# Patient Record
Sex: Female | Born: 1935 | Hispanic: No | Smoking: Never smoker
Health system: Southern US, Community
[De-identification: ages and names within clinical notes are randomized; demographics above are authoritative.]

## PROBLEM LIST (undated history)

## (undated) DIAGNOSIS — J449 Chronic obstructive pulmonary disease, unspecified: Secondary | ICD-10-CM

## (undated) DIAGNOSIS — Z950 Presence of cardiac pacemaker: Secondary | ICD-10-CM

## (undated) DIAGNOSIS — I509 Heart failure, unspecified: Secondary | ICD-10-CM

## (undated) DIAGNOSIS — I4891 Unspecified atrial fibrillation: Secondary | ICD-10-CM

## (undated) DIAGNOSIS — Z95810 Presence of automatic (implantable) cardiac defibrillator: Secondary | ICD-10-CM

## (undated) HISTORY — PX: PACEMAKER INSERTION: SHX728

---

## 2005-07-07 ENCOUNTER — Ambulatory Visit (HOSPITAL_COMMUNITY): Admission: RE | Admit: 2005-07-07 | Discharge: 2005-07-07 | Payer: Self-pay

## 2011-08-18 ENCOUNTER — Emergency Department (HOSPITAL_COMMUNITY): Payer: Medicare Other

## 2011-08-18 ENCOUNTER — Inpatient Hospital Stay (HOSPITAL_COMMUNITY)
Admission: EM | Admit: 2011-08-18 | Discharge: 2011-08-22 | DRG: 208 | Disposition: A | Discharge status: Home / Self Care | Payer: Medicare Other | Attending: Internal Medicine | Admitting: Internal Medicine

## 2011-08-18 DIAGNOSIS — K922 Gastrointestinal hemorrhage, unspecified: Secondary | ICD-10-CM | POA: Diagnosis present

## 2011-08-18 DIAGNOSIS — Z23 Encounter for immunization: Secondary | ICD-10-CM

## 2011-08-18 DIAGNOSIS — I509 Heart failure, unspecified: Secondary | ICD-10-CM | POA: Diagnosis present

## 2011-08-18 DIAGNOSIS — D649 Anemia, unspecified: Secondary | ICD-10-CM | POA: Diagnosis present

## 2011-08-18 DIAGNOSIS — Z7982 Long term (current) use of aspirin: Secondary | ICD-10-CM

## 2011-08-18 DIAGNOSIS — J449 Chronic obstructive pulmonary disease, unspecified: Secondary | ICD-10-CM | POA: Diagnosis present

## 2011-08-18 DIAGNOSIS — Z79899 Other long term (current) drug therapy: Secondary | ICD-10-CM

## 2011-08-18 DIAGNOSIS — I4891 Unspecified atrial fibrillation: Secondary | ICD-10-CM | POA: Diagnosis present

## 2011-08-18 DIAGNOSIS — Z9911 Dependence on respirator [ventilator] status: Secondary | ICD-10-CM

## 2011-08-18 DIAGNOSIS — J4489 Other specified chronic obstructive pulmonary disease: Secondary | ICD-10-CM | POA: Diagnosis present

## 2011-08-18 DIAGNOSIS — R7881 Bacteremia: Secondary | ICD-10-CM | POA: Diagnosis present

## 2011-08-18 DIAGNOSIS — N189 Chronic kidney disease, unspecified: Secondary | ICD-10-CM | POA: Diagnosis present

## 2011-08-18 DIAGNOSIS — R0902 Hypoxemia: Secondary | ICD-10-CM

## 2011-08-18 DIAGNOSIS — IMO0002 Reserved for concepts with insufficient information to code with codable children: Secondary | ICD-10-CM | POA: Diagnosis present

## 2011-08-18 DIAGNOSIS — E876 Hypokalemia: Secondary | ICD-10-CM | POA: Diagnosis present

## 2011-08-18 DIAGNOSIS — I129 Hypertensive chronic kidney disease with stage 1 through stage 4 chronic kidney disease, or unspecified chronic kidney disease: Secondary | ICD-10-CM | POA: Diagnosis present

## 2011-08-18 DIAGNOSIS — D6489 Other specified anemias: Secondary | ICD-10-CM

## 2011-08-18 DIAGNOSIS — Z7901 Long term (current) use of anticoagulants: Secondary | ICD-10-CM

## 2011-08-18 DIAGNOSIS — I5023 Acute on chronic systolic (congestive) heart failure: Secondary | ICD-10-CM | POA: Diagnosis present

## 2011-08-18 DIAGNOSIS — Z950 Presence of cardiac pacemaker: Secondary | ICD-10-CM

## 2011-08-18 DIAGNOSIS — J9600 Acute respiratory failure, unspecified whether with hypoxia or hypercapnia: Principal | ICD-10-CM | POA: Diagnosis present

## 2011-08-18 DIAGNOSIS — J811 Chronic pulmonary edema: Secondary | ICD-10-CM | POA: Diagnosis present

## 2011-08-18 LAB — DIFFERENTIAL
Basophils Absolute: 0 10*3/uL (ref 0.0–0.1)
Basophils Relative: 0 % (ref 0–1)
Eosinophils Absolute: 0 10*3/uL (ref 0.0–0.7)
Eosinophils Relative: 2 % (ref 0–5)
Lymphocytes Relative: 50 % — ABNORMAL HIGH (ref 12–46)
Monocytes Absolute: 0.3 10*3/uL (ref 0.1–1.0)
Monocytes Relative: 7 % (ref 3–12)
Monocytes Relative: 2 % — ABNORMAL LOW (ref 3–12)
Neutro Abs: 2.3 10*3/uL (ref 1.7–7.7)

## 2011-08-18 LAB — COMPREHENSIVE METABOLIC PANEL
ALT: 14 U/L (ref 0–35)
AST: 19 U/L (ref 0–37)
Alkaline Phosphatase: 33 U/L — ABNORMAL LOW (ref 39–117)
CO2: 10 mEq/L — CL (ref 19–32)
Chloride: 128 mEq/L — ABNORMAL HIGH (ref 96–112)
GFR calc non Af Amer: >60 mL/min (ref >60)
Glucose, Bld: 129 mg/dL — ABNORMAL HIGH (ref 70–99)
Sodium: 145 mEq/L (ref 135–145)
Total Bilirubin: 0.2 mg/dL — ABNORMAL LOW (ref 0.3–1.2)

## 2011-08-18 LAB — POCT I-STAT TROPONIN I: Troponin i, poc: 0.04 ng/mL (ref 0.00–0.08)

## 2011-08-18 LAB — POCT I-STAT 3, ART BLOOD GAS (G3+)
Bicarbonate: 24.9 mEq/L — ABNORMAL HIGH (ref 20.0–24.0)
Bicarbonate: 26.6 mEq/L — ABNORMAL HIGH (ref 20.0–24.0)
O2 Saturation: 83 %
Patient temperature: 36.7
TCO2: 27 mmol/L (ref 0–100)
pH, Arterial: 7.151 — CL (ref 7.350–7.400)
pH, Arterial: 7.26 — ABNORMAL LOW (ref 7.350–7.400)

## 2011-08-18 LAB — URINE MICROSCOPIC-ADD ON

## 2011-08-18 LAB — TROPONIN I: Troponin I: <0.3 ng/mL (ref <0.30)

## 2011-08-18 LAB — BASIC METABOLIC PANEL
CO2: 25 mEq/L (ref 19–32)
Chloride: 103 mEq/L (ref 96–112)
Creatinine, Ser: 1.53 mg/dL — ABNORMAL HIGH (ref 0.50–1.10)
GFR calc Af Amer: 32 mL/min — ABNORMAL LOW (ref >60)
GFR calc Af Amer: 39 mL/min — ABNORMAL LOW (ref >60)
GFR calc non Af Amer: 26 mL/min — ABNORMAL LOW (ref >60)
Potassium: 3.4 mEq/L — ABNORMAL LOW (ref 3.5–5.1)
Potassium: 4.2 mEq/L (ref 3.5–5.1)
Sodium: 142 mEq/L (ref 135–145)
Sodium: 140 mEq/L (ref 135–145)

## 2011-08-18 LAB — LACTIC ACID, PLASMA: Lactic Acid, Venous: 1.7 mmol/L (ref 0.5–2.2)

## 2011-08-18 LAB — CBC
HCT: 16.2 % — ABNORMAL LOW (ref 36.0–46.0)
HCT: 33 % — ABNORMAL LOW (ref 36.0–46.0)
Hemoglobin: 5.1 g/dL — CL (ref 12.0–15.0)
Hemoglobin: 10.9 g/dL — ABNORMAL LOW (ref 12.0–15.0)
MCHC: 32.7 g/dL (ref 30.0–36.0)
MCV: 86.2 fL (ref 78.0–100.0)
RBC: 3.91 MIL/uL (ref 3.87–5.11)
RDW: 13.6 % (ref 11.5–15.5)
WBC: 5.6 10*3/uL (ref 4.0–10.5)
WBC: 11.3 10*3/uL — ABNORMAL HIGH (ref 4.0–10.5)

## 2011-08-18 LAB — PROTIME-INR
INR: 8.19 (ref 0.00–1.49)
INR: 1.8 — ABNORMAL HIGH (ref 0.00–1.49)
Prothrombin Time: 21.2 seconds — ABNORMAL HIGH (ref 11.6–15.2)

## 2011-08-18 LAB — URINALYSIS, ROUTINE W REFLEX MICROSCOPIC
Bilirubin Urine: NEGATIVE
Leukocytes, UA: NEGATIVE
Nitrite: NEGATIVE
Specific Gravity, Urine: 1.016 (ref 1.005–1.030)
Urobilinogen, UA: 1 mg/dL (ref 0.0–1.0)

## 2011-08-18 LAB — CK TOTAL AND CKMB (NOT AT ARMC)
CK, MB: 5.3 ng/mL — ABNORMAL HIGH (ref 0.3–4.0)
CK, MB: 5.7 ng/mL — ABNORMAL HIGH (ref 0.3–4.0)
Relative Index: 5.6 — ABNORMAL HIGH (ref 0.0–2.5)
Total CK: 209 U/L — ABNORMAL HIGH (ref 7–177)
Total CK: 102 U/L (ref 7–177)

## 2011-08-18 LAB — GLUCOSE, CAPILLARY: Glucose-Capillary: 179 mg/dL — ABNORMAL HIGH (ref 70–99)

## 2011-08-18 LAB — ABO/RH: ABO/RH(D): A POS

## 2011-08-19 ENCOUNTER — Inpatient Hospital Stay (HOSPITAL_COMMUNITY): Payer: Medicare Other

## 2011-08-19 DIAGNOSIS — J9600 Acute respiratory failure, unspecified whether with hypoxia or hypercapnia: Secondary | ICD-10-CM

## 2011-08-19 DIAGNOSIS — J810 Acute pulmonary edema: Secondary | ICD-10-CM

## 2011-08-19 DIAGNOSIS — I428 Other cardiomyopathies: Secondary | ICD-10-CM

## 2011-08-19 LAB — GLUCOSE, CAPILLARY: Glucose-Capillary: 122 mg/dL — ABNORMAL HIGH (ref 70–99)

## 2011-08-19 LAB — CARDIAC PANEL(CRET KIN+CKTOT+MB+TROPI)
CK, MB: 3.5 ng/mL (ref 0.3–4.0)
Total CK: 64 U/L (ref 7–177)
Troponin I: <0.3 ng/mL (ref <0.30)

## 2011-08-19 LAB — COMPREHENSIVE METABOLIC PANEL
BUN: 37 mg/dL — ABNORMAL HIGH (ref 6–23)
Calcium: 9.5 mg/dL (ref 8.4–10.5)
GFR calc Af Amer: 37 mL/min — ABNORMAL LOW (ref >60)
Glucose, Bld: 120 mg/dL — ABNORMAL HIGH (ref 70–99)
Total Protein: 6.3 g/dL (ref 6.0–8.3)

## 2011-08-19 LAB — POCT I-STAT 3, ART BLOOD GAS (G3+)
Acid-Base Excess: 1 mmol/L (ref 0.0–2.0)
Bicarbonate: 25.7 mEq/L — ABNORMAL HIGH (ref 20.0–24.0)
Patient temperature: 37.4
TCO2: 27 mmol/L (ref 0–100)
pH, Arterial: 7.381 (ref 7.350–7.400)
pH, Arterial: 7.393 (ref 7.350–7.400)
pO2, Arterial: 141 mmHg — ABNORMAL HIGH (ref 80.0–100.0)

## 2011-08-19 LAB — MAGNESIUM: Magnesium: 2.3 mg/dL (ref 1.5–2.5)

## 2011-08-19 LAB — CBC
HCT: 31.1 % — ABNORMAL LOW (ref 36.0–46.0)
Platelets: 205 10*3/uL (ref 150–400)
RBC: 3.72 MIL/uL — ABNORMAL LOW (ref 3.87–5.11)
RDW: 13.8 % (ref 11.5–15.5)
WBC: 12.1 10*3/uL — ABNORMAL HIGH (ref 4.0–10.5)

## 2011-08-19 LAB — URINE CULTURE: Culture  Setup Time: 201209142211

## 2011-08-20 ENCOUNTER — Inpatient Hospital Stay (HOSPITAL_COMMUNITY): Payer: Medicare Other

## 2011-08-20 DIAGNOSIS — I428 Other cardiomyopathies: Secondary | ICD-10-CM

## 2011-08-20 DIAGNOSIS — J810 Acute pulmonary edema: Secondary | ICD-10-CM

## 2011-08-20 DIAGNOSIS — J9600 Acute respiratory failure, unspecified whether with hypoxia or hypercapnia: Secondary | ICD-10-CM

## 2011-08-20 LAB — BASIC METABOLIC PANEL
Calcium: 8.8 mg/dL (ref 8.4–10.5)
GFR calc non Af Amer: 36 mL/min — ABNORMAL LOW (ref >60)
Sodium: 141 mEq/L (ref 135–145)

## 2011-08-20 LAB — GLUCOSE, CAPILLARY
Glucose-Capillary: 102 mg/dL — ABNORMAL HIGH (ref 70–99)
Glucose-Capillary: 106 mg/dL — ABNORMAL HIGH (ref 70–99)
Glucose-Capillary: 111 mg/dL — ABNORMAL HIGH (ref 70–99)
Glucose-Capillary: 112 mg/dL — ABNORMAL HIGH (ref 70–99)
Glucose-Capillary: 130 mg/dL — ABNORMAL HIGH (ref 70–99)

## 2011-08-20 LAB — CULTURE, BLOOD (ROUTINE X 2)

## 2011-08-20 LAB — CBC
MCHC: 32.4 g/dL (ref 30.0–36.0)
RDW: 14 % (ref 11.5–15.5)

## 2011-08-20 LAB — PRO B NATRIURETIC PEPTIDE: Pro B Natriuretic peptide (BNP): 24872 pg/mL — ABNORMAL HIGH (ref 0–450)

## 2011-08-21 DIAGNOSIS — Z9911 Dependence on respirator [ventilator] status: Secondary | ICD-10-CM

## 2011-08-21 LAB — BASIC METABOLIC PANEL
Calcium: 9.2 mg/dL (ref 8.4–10.5)
Creatinine, Ser: 1.3 mg/dL — ABNORMAL HIGH (ref 0.50–1.10)
GFR calc non Af Amer: 39 mL/min — ABNORMAL LOW (ref >60)
Glucose, Bld: 104 mg/dL — ABNORMAL HIGH (ref 70–99)
Sodium: 139 mEq/L (ref 135–145)

## 2011-08-21 LAB — GLUCOSE, CAPILLARY: Glucose-Capillary: 111 mg/dL — ABNORMAL HIGH (ref 70–99)

## 2011-08-21 LAB — CBC
MCHC: 32.8 g/dL (ref 30.0–36.0)
MCV: 84.2 fL (ref 78.0–100.0)
Platelets: 226 10*3/uL (ref 150–400)
RDW: 13.8 % (ref 11.5–15.5)
WBC: 7.6 10*3/uL (ref 4.0–10.5)

## 2011-08-21 LAB — CULTURE, RESPIRATORY W GRAM STAIN

## 2011-08-22 DIAGNOSIS — J9600 Acute respiratory failure, unspecified whether with hypoxia or hypercapnia: Secondary | ICD-10-CM

## 2011-08-22 DIAGNOSIS — I428 Other cardiomyopathies: Secondary | ICD-10-CM

## 2011-08-22 DIAGNOSIS — Z9911 Dependence on respirator [ventilator] status: Secondary | ICD-10-CM

## 2011-08-22 DIAGNOSIS — J810 Acute pulmonary edema: Secondary | ICD-10-CM

## 2011-08-22 LAB — GLUCOSE, CAPILLARY: Glucose-Capillary: 106 mg/dL — ABNORMAL HIGH (ref 70–99)

## 2011-08-22 LAB — CROSSMATCH
ABO/RH(D): A POS
Unit division: 0
Unit division: 0

## 2011-08-22 LAB — BASIC METABOLIC PANEL
BUN: 24 mg/dL — ABNORMAL HIGH (ref 6–23)
CO2: 29 mEq/L (ref 19–32)
Chloride: 102 mEq/L (ref 96–112)
Glucose, Bld: 104 mg/dL — ABNORMAL HIGH (ref 70–99)
Potassium: 4.3 mEq/L (ref 3.5–5.1)
Sodium: 138 mEq/L (ref 135–145)

## 2011-08-22 LAB — CBC
HCT: 31.5 % — ABNORMAL LOW (ref 36.0–46.0)
Hemoglobin: 10.4 g/dL — ABNORMAL LOW (ref 12.0–15.0)
MCHC: 33 g/dL (ref 30.0–36.0)
RBC: 3.74 MIL/uL — ABNORMAL LOW (ref 3.87–5.11)
WBC: 7.1 10*3/uL (ref 4.0–10.5)

## 2011-08-22 LAB — PHOSPHORUS: Phosphorus: 2.8 mg/dL (ref 2.3–4.6)

## 2011-08-24 ENCOUNTER — Encounter: Payer: Medicare Other | Admitting: *Deleted

## 2011-08-24 ENCOUNTER — Ambulatory Visit (INDEPENDENT_AMBULATORY_CARE_PROVIDER_SITE_OTHER): Payer: Medicare Other | Admitting: *Deleted

## 2011-08-24 DIAGNOSIS — Z7901 Long term (current) use of anticoagulants: Secondary | ICD-10-CM

## 2011-08-24 DIAGNOSIS — I4891 Unspecified atrial fibrillation: Secondary | ICD-10-CM | POA: Insufficient documentation

## 2011-08-24 LAB — CULTURE, BLOOD (ROUTINE X 2): Culture: NO GROWTH

## 2011-08-25 ENCOUNTER — Ambulatory Visit (INDEPENDENT_AMBULATORY_CARE_PROVIDER_SITE_OTHER): Payer: Medicare Other | Admitting: Adult Health

## 2011-08-25 ENCOUNTER — Encounter: Payer: Self-pay | Admitting: Adult Health

## 2011-08-25 VITALS — BP 118/60 | HR 96 | Temp 98.1°F | Ht 61.0 in | Wt 145.4 lb

## 2011-08-25 DIAGNOSIS — J9600 Acute respiratory failure, unspecified whether with hypoxia or hypercapnia: Secondary | ICD-10-CM

## 2011-08-25 DIAGNOSIS — J9690 Respiratory failure, unspecified, unspecified whether with hypoxia or hypercapnia: Secondary | ICD-10-CM

## 2011-08-25 MED ORDER — IPRATROPIUM-ALBUTEROL 18-103 MCG/ACT IN AERO: 2.0000 | INHALATION_SPRAY | Freq: Four times a day (QID) | RESPIRATORY_TRACT | Status: DC | PRN

## 2011-08-25 NOTE — Progress Notes (Signed)
  Subjective:    Patient ID: Katie Carlson, female    DOB: Aug 02, 1922, 77 y.o.   MRN: [id number]  HPI 77 yo female pt from [place] admitted 9/14/-08/22/11 for acute resp failure w/ pulm edema and Cardiomyopathy (hx of EF at 10%) with CCM consult/admit   08/25/2011 Post Hospital follow up  Pt was  admitted 9/14/-08/22/11 for acute resp failure w/ pulm edema and Cardiomyopathy (hx of EF at 10%) with CCM consult/admit . Pt was visiting family locally with acute dyspnea. Presented to ER found to be in acute resp distress and hypoxic. CXR showed edema c/w pulmonary edema . She did require brief intubation w/ vent support. She was received aggressive diuresis. She was weaned off vent w/ improved clearance on xray . Discharged on her previous regimen along w/ coumadin for hx of Atrial Fib.   Since discharge she is feeling much better with decreased dyspnea. No leg swelling. No cough or fever. She is following with the coumadin clinic until she leaves next week back to [place] . She has ov with her cardiology on 10/1 when she returns. Her family is with her today.     Review of Systems Constitutional:   No  weight loss, night sweats,  Fevers, chills,  +fatigue   HEENT:   No headaches,  Difficulty swallowing,  Tooth/dental problems, or  Sore throat,                No sneezing, itching, ear ache, nasal congestion, post nasal drip,   CV:  No chest pain,  Orthopnea, PND, swelling in lower extremities, anasarca, dizziness, palpitations, syncope.   GI  No heartburn, indigestion, abdominal pain, nausea, vomiting, diarrhea, change in bowel habits, loss of appetite, bloody stools.   Resp:  No excess mucus, no productive cough,  No non-productive cough,  No coughing up of blood.  No change in color of mucus.  No wheezing.  No chest wall deformity  Skin: no rash or lesions.  GU: no dysuria, change in color of urine, no urgency or frequency.  No flank pain, no hematuria   MS:  No joint pain or swelling.   No decreased range of motion.  No back pain.  Psych:  No change in mood or affect. No depression or anxiety.  No memory loss.         Objective:   Physical Exam GEN: A/Ox3; pleasant , NAD, well nourished -appears younger than stated age.   HEENT:  [address]/AT,  EACs-clear, TMs-wnl, NOSE-clear, THROAT-clear, no lesions, no postnasal drip or exudate noted.   NECK:  Supple w/ fair ROM; no JVD; normal carotid impulses w/o bruits; no thyromegaly or nodules palpated; no lymphadenopathy.  RESP  Coarse BS w/ no wheezing or crackles no accessory muscle use, no dullness to percussion  CARD:  RRR, no m/r/g  , no peripheral edema, pulses intact, no cyanosis or clubbing.  GI:   Soft & nt; nml bowel sounds; no organomegaly or masses detected.  Musco: Warm bil, no deformities or joint swelling noted.   Neuro: alert, no focal deficits noted.    Skin: Warm, no lesions or rashes          Assessment & Plan:

## 2011-08-25 NOTE — Progress Notes (Signed)
Reviewed and agree with plan.

## 2011-08-25 NOTE — Assessment & Plan Note (Addendum)
Resolved secondary to pulmonary edema (hx of severe cardiomyopathy EF of 10%)   Responded very well to aggressive diuresis w/ brief vent suppport   Plan:  Continue on same meds.  follow up with coumadin clinic as planned next week prior to leaving    follow up with Cardilogist as planned on Oct 1st back home  Please contact office for sooner follow up if symptoms do not improve or worsen or seek emergency care

## 2011-08-25 NOTE — Patient Instructions (Signed)
Continue on same meds.  follow up with coumadin clinic as planned next week prior to leaving  Safe travels back to [place]  follow up with Cardilogist as planned on Oct 1st back home  Please contact office for sooner follow up if symptoms do not improve or worsen or seek emergency care

## 2011-08-29 NOTE — Discharge Summary (Signed)
NAME:  Katie Carlson, Katie Carlson              ACCOUNT NO.:  [id number]  MEDICAL RECORD NO.:  [id number]  LOCATION:  4738                         FACILITY:  [facility]  PHYSICIAN:  Coralyn Helling, MD        DATE OF BIRTH:  Jul 05, 1922  DATE OF ADMISSION:  08/18/2011 DATE OF DISCHARGE:  08/22/2011                              DISCHARGE SUMMARY   FINAL DIAGNOSES: 1. Acute on chronic systolic respiratory failure with pulmonary edema     and resolved respiratory failure. 2. Anemia. 3. Chronic kidney disease. 4. History of atrial fibrillation. 5. Hypertension. 6. Chronic obstructive pulmonary disease.  CONSULTANT:  None.  PROCEDURES: 1. Endotracheal tube placed on August 18, 2011, and moved on     August 20, 2011. 2. Triple-lumen catheter placed on August 18, 2011, and removed on     August 20, 2011.  LABORATORY DATA:  Date August 22, 2011, phosphorus 2.8, magnesium 2.2, sodium 148, potassium 4.3, chloride 102, CO2 of 29, glucose 104, BUN 24, creatinine 1.24, white blood cell count 7.1, hemoglobin 10.4, hematocrit 31.5, platelet count of 219.  Sputum culture on August 19, 2011, demonstrated nonpathogenic oropharyngeal-type flora.  PT/INR on August 21, 2011, was 17.8/1.44.  blood cultures x2 both obtained on August 18, 2011, demonstrated 1/2 coag-negative staph.  This was interpreted as a contaminant.  BRIEF HISTORY:  This is an 77 year old female patient who is visiting family from [place], presented to the emergency room on August 18, 2011, with hypoxic respiratory failure and acute respiratory distress.  She has a known history of ejection fraction of 10%.  She was seen in evaluation by emergency room physician, failed noninvasive positive pressure ventilation attempt, therefore was intubated for acute respiratory failure.  Pulmonary Critical Care Team was asked to admit in the setting respiratory failure.  HOSPITAL COURSE BY DISCHARGE DIAGNOSIS: 1. Acute on  chronic systolic heart failure with resultant pulmonary     edema and acute respiratory failure.  Mr. Katie Carlson was admitted to     Intensive Care following evaluation in the emergency room.     Therapeutic interventions included positive pressure ventilation,     aggressive IV systemic diuresis, and titration of supplemental     oxygen and PEEP therapy.  She responded positively to these     interventions, and was successfully extubated the following day.     She improved over the course of her hospital course.  She will be     discharged to home on a routine diuretic and antihypertensive     regimen.  She is instructed that she will follow up with our nurse     practitioner in the outpatient setting, and then follow up with her     Outpatient Cardiology when she returns to [place].92. 2. History of atrial fibrillation.  For this, she will continue her     routine rate control medications at home, she will also continue     her home warfarin dosing.  She is on Lasix dose at 2.5 mg daily, we     will resume this prior to discharge, and we will have her stop at     the [facility] prior to discharge to  ensure her INR is     within safe margin. 3. History of chronic kidney disease.  Currently, her creatinine is     stable and as noted above.  No change in her medical     recommendations will be made. 4. History of chronic anemia.  For this, she will continue her ferrous     sulfate. 5. History of hypertension.  For this, we will continue her current     regimen. 6. History of COPD.  For this, she will continue p.r.n. Combivent.  DISCHARGE INSTRUCTIONS:  She will follow a low-sodium diet.  DISCHARGE MEDICATIONS: 1. Acetaminophen 325 mg p.o. q.6 h. p.r.n. 2. Aspirin 81 mg daily. 3. Calcitriol 0.25 mg on Mondays and Thursdays. 4. Carvedilol 6.25 mg daily. 5. Combivent 2 puffs every 4 hours as needed. 6. Ferrous sulfate 65 mg daily. 7. Furosemide 40 mg daily. 8.  Omeprazole 20 mg daily. 9. Pravastatin 40 mg at bedtime. 10.Warfarin 2.5 mg daily, she will be seen in the Coumadin Clinic for     further evaluation of her INR.  INSTRUCTIONS:  She will follow up with nurse practitioner, Rubye Oaks on Friday, August 25, 2011, at 9:30.  She will also follow up in our Coumadin Clinic on Thursday, August 24, 2011, at 8:45.  She has been instructed to follow up with her home cardiologist in [place] upon her arrival back home.     Katie Resides, NP   ______________________________ Coralyn Helling, MD    PB/MEDQ  D:  08/22/2011  T:  08/22/2011  Job:  [id number]  cc:   Rubye Oaks, NP [facility]  Electronically Signed by Katie Resides NP on 08/28/2011 04:31:38 PM Electronically Signed by Coralyn Helling MD on 08/29/2011 01:19:33 PM

## 2011-08-30 ENCOUNTER — Ambulatory Visit (INDEPENDENT_AMBULATORY_CARE_PROVIDER_SITE_OTHER): Payer: Medicare Other | Admitting: *Deleted

## 2011-08-30 DIAGNOSIS — I4891 Unspecified atrial fibrillation: Secondary | ICD-10-CM

## 2011-08-30 DIAGNOSIS — Z7901 Long term (current) use of anticoagulants: Secondary | ICD-10-CM

## 2011-08-31 ENCOUNTER — Encounter: Payer: Medicare Other | Admitting: *Deleted

## 2013-07-04 IMAGING — CR DG CHEST 1V PORT
1 series · 1 of 1 positions shown · non-contrast
Comparison: 08/18/2011

CLINICAL DATA: CHF

PORTABLE CHEST - 1 VIEW

[view not recorded]
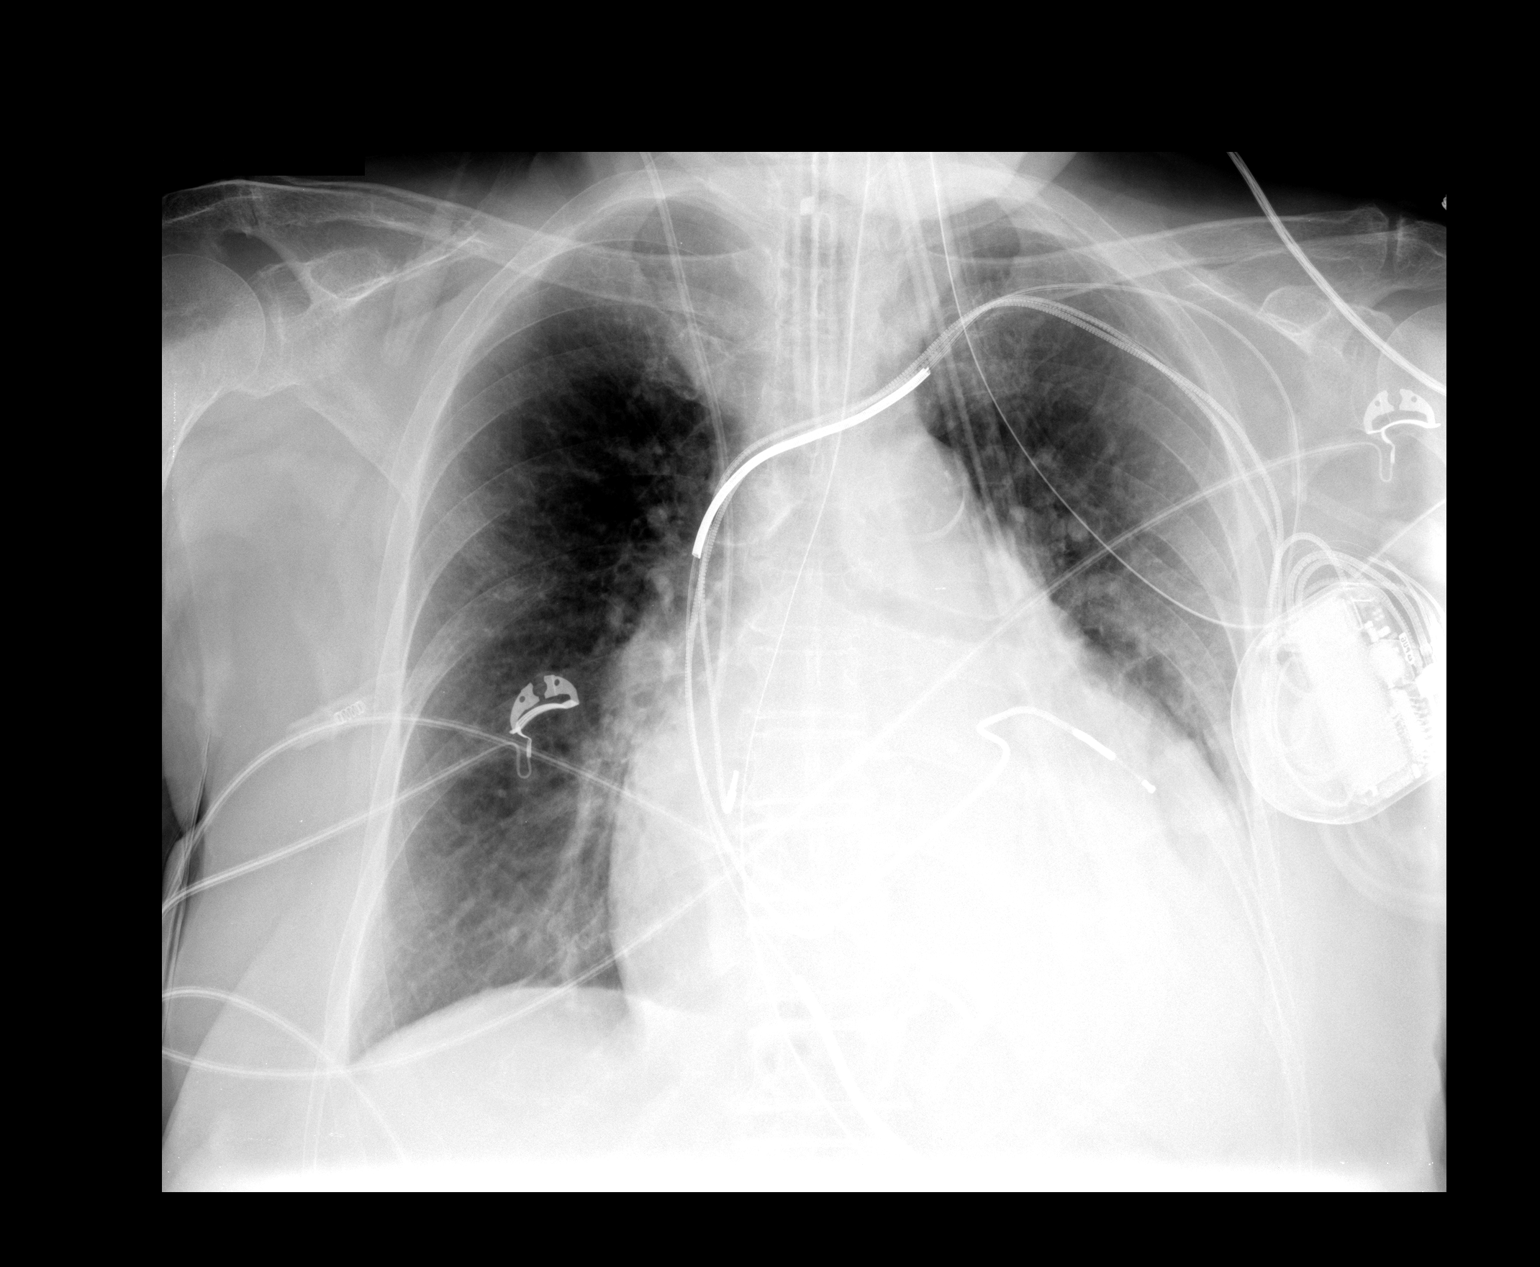

[1 of 1 positions shown; findings below may reference images not displayed]

FINDINGS: Cardiomegaly with pulmonary vascular congestion.  No
frank interstitial edema, improved. Left lung base is not well
evaluated due to overlying heart shadow.  No pleural effusion or
pneumothorax.

Left subclavian ICD.
IMPRESSION: Cardiomegaly with pulmonary vascular congestion.  No frank
interstitial edema, improved.

## 2014-08-26 ENCOUNTER — Encounter (HOSPITAL_COMMUNITY): Payer: Self-pay | Admitting: Emergency Medicine

## 2014-08-26 ENCOUNTER — Emergency Department (HOSPITAL_COMMUNITY): Payer: Medicare Other

## 2014-08-26 ENCOUNTER — Inpatient Hospital Stay (HOSPITAL_COMMUNITY)
Admission: EM | Admit: 2014-08-26 | Discharge: 2014-08-28 | DRG: 292 | Disposition: A | Discharge status: Home / Self Care | Payer: Medicare Other | Attending: Internal Medicine | Admitting: Internal Medicine

## 2014-08-26 DIAGNOSIS — I509 Heart failure, unspecified: Secondary | ICD-10-CM | POA: Diagnosis present

## 2014-08-26 DIAGNOSIS — Z950 Presence of cardiac pacemaker: Secondary | ICD-10-CM

## 2014-08-26 DIAGNOSIS — Z7982 Long term (current) use of aspirin: Secondary | ICD-10-CM

## 2014-08-26 DIAGNOSIS — I4891 Unspecified atrial fibrillation: Secondary | ICD-10-CM | POA: Diagnosis present

## 2014-08-26 DIAGNOSIS — E785 Hyperlipidemia, unspecified: Secondary | ICD-10-CM | POA: Diagnosis present

## 2014-08-26 DIAGNOSIS — Z7901 Long term (current) use of anticoagulants: Secondary | ICD-10-CM

## 2014-08-26 DIAGNOSIS — R0602 Shortness of breath: Secondary | ICD-10-CM

## 2014-08-26 DIAGNOSIS — N184 Chronic kidney disease, stage 4 (severe): Secondary | ICD-10-CM

## 2014-08-26 DIAGNOSIS — I129 Hypertensive chronic kidney disease with stage 1 through stage 4 chronic kidney disease, or unspecified chronic kidney disease: Secondary | ICD-10-CM | POA: Diagnosis present

## 2014-08-26 DIAGNOSIS — I10 Essential (primary) hypertension: Secondary | ICD-10-CM

## 2014-08-26 DIAGNOSIS — J449 Chronic obstructive pulmonary disease, unspecified: Secondary | ICD-10-CM | POA: Diagnosis present

## 2014-08-26 DIAGNOSIS — I5023 Acute on chronic systolic (congestive) heart failure: Secondary | ICD-10-CM | POA: Diagnosis present

## 2014-08-26 DIAGNOSIS — J4489 Other specified chronic obstructive pulmonary disease: Secondary | ICD-10-CM | POA: Diagnosis present

## 2014-08-26 DIAGNOSIS — Z79899 Other long term (current) drug therapy: Secondary | ICD-10-CM | POA: Diagnosis not present

## 2014-08-26 DIAGNOSIS — I482 Chronic atrial fibrillation, unspecified: Secondary | ICD-10-CM

## 2014-08-26 HISTORY — DX: Unspecified atrial fibrillation: I48.91

## 2014-08-26 HISTORY — DX: Presence of cardiac pacemaker: Z95.0

## 2014-08-26 HISTORY — DX: Presence of automatic (implantable) cardiac defibrillator: Z95.810

## 2014-08-26 HISTORY — DX: Chronic obstructive pulmonary disease, unspecified: J44.9

## 2014-08-26 HISTORY — DX: Heart failure, unspecified: I50.9

## 2014-08-26 LAB — BASIC METABOLIC PANEL
Anion gap: 15 (ref 5–15)
BUN: 33 mg/dL — AB (ref 6–23)
CALCIUM: 9.3 mg/dL (ref 8.4–10.5)
CO2: 27 mEq/L (ref 19–32)
CREATININE: 1.92 mg/dL — AB (ref 0.50–1.10)
Chloride: 95 mEq/L — ABNORMAL LOW (ref 96–112)
GFR, EST AFRICAN AMERICAN: 25 mL/min — AB (ref >90)
GFR, EST NON AFRICAN AMERICAN: 22 mL/min — AB (ref >90)
GLUCOSE: 126 mg/dL — AB (ref 70–99)
POTASSIUM: 4.1 meq/L (ref 3.7–5.3)
Sodium: 137 mEq/L (ref 137–147)

## 2014-08-26 LAB — CBC
HCT: 36.4 % (ref 36.0–46.0)
Hemoglobin: 12 g/dL (ref 12.0–15.0)
MCH: 26.5 pg (ref 26.0–34.0)
MCHC: 33 g/dL (ref 30.0–36.0)
MCV: 80.5 fL (ref 78.0–100.0)
PLATELETS: 198 10*3/uL (ref 150–400)
RBC: 4.52 MIL/uL (ref 3.87–5.11)
RDW: 16.9 % — AB (ref 11.5–15.5)
WBC: 5.9 10*3/uL (ref 4.0–10.5)

## 2014-08-26 LAB — PROTIME-INR
INR: 3.57 — ABNORMAL HIGH (ref 0.00–1.49)
Prothrombin Time: 35.7 seconds — ABNORMAL HIGH (ref 11.6–15.2)

## 2014-08-26 LAB — PRO B NATRIURETIC PEPTIDE: Pro B Natriuretic peptide (BNP): 64829 pg/mL — ABNORMAL HIGH (ref 0–450)

## 2014-08-26 LAB — TROPONIN I: Troponin I: <0.3 ng/mL (ref <0.30)

## 2014-08-26 LAB — I-STAT TROPONIN, ED: TROPONIN I, POC: 0.09 ng/mL — AB (ref 0.00–0.08)

## 2014-08-26 MED ORDER — ALBUTEROL SULFATE (2.5 MG/3ML) 0.083% IN NEBU: 2.5000 mg | INHALATION_SOLUTION | RESPIRATORY_TRACT | Status: DC | PRN

## 2014-08-26 MED ORDER — SERTRALINE HCL 50 MG PO TABS
50.0000 mg | ORAL_TABLET | Freq: Every day | ORAL | Status: DC
  Administered 2014-08-27 – 2014-08-28 (×2): 50 mg via ORAL
  Filled 2014-08-26 (×2): qty 1

## 2014-08-26 MED ORDER — DOCUSATE SODIUM 100 MG PO CAPS
100.0000 mg | ORAL_CAPSULE | Freq: Every day | ORAL | Status: DC | PRN
  Filled 2014-08-26: qty 1

## 2014-08-26 MED ORDER — FERROUS SULFATE 325 (65 FE) MG PO TABS
325.0000 mg | ORAL_TABLET | Freq: Every day | ORAL | Status: DC
  Administered 2014-08-27 – 2014-08-28 (×2): 325 mg via ORAL
  Filled 2014-08-26 (×3): qty 1

## 2014-08-26 MED ORDER — LISINOPRIL 2.5 MG PO TABS
2.5000 mg | ORAL_TABLET | Freq: Every day | ORAL | Status: DC
  Administered 2014-08-27 – 2014-08-28 (×2): 2.5 mg via ORAL
  Filled 2014-08-26 (×2): qty 1

## 2014-08-26 MED ORDER — ASPIRIN EC 81 MG PO TBEC
81.0000 mg | DELAYED_RELEASE_TABLET | Freq: Every day | ORAL | Status: DC
  Administered 2014-08-27 – 2014-08-28 (×2): 81 mg via ORAL
  Filled 2014-08-26 (×2): qty 1

## 2014-08-26 MED ORDER — ACETAMINOPHEN 650 MG RE SUPP: 650.0000 mg | Freq: Four times a day (QID) | RECTAL | Status: DC | PRN

## 2014-08-26 MED ORDER — PANTOPRAZOLE SODIUM 40 MG PO TBEC
40.0000 mg | DELAYED_RELEASE_TABLET | Freq: Every day | ORAL | Status: DC
  Administered 2014-08-27 – 2014-08-28 (×2): 40 mg via ORAL
  Filled 2014-08-26 (×2): qty 1

## 2014-08-26 MED ORDER — ALUM & MAG HYDROXIDE-SIMETH 200-200-20 MG/5ML PO SUSP: 30.0000 mL | Freq: Four times a day (QID) | ORAL | Status: DC | PRN

## 2014-08-26 MED ORDER — TIOTROPIUM BROMIDE MONOHYDRATE 18 MCG IN CAPS
18.0000 ug | ORAL_CAPSULE | Freq: Every day | RESPIRATORY_TRACT | Status: DC
  Administered 2014-08-27 – 2014-08-28 (×2): 18 ug via RESPIRATORY_TRACT
  Filled 2014-08-26: qty 5

## 2014-08-26 MED ORDER — SODIUM CHLORIDE 0.9 % IV SOLN: 250.0000 mL | INTRAVENOUS | Status: DC | PRN

## 2014-08-26 MED ORDER — ONDANSETRON HCL 4 MG PO TABS: 4.0000 mg | ORAL_TABLET | Freq: Four times a day (QID) | ORAL | Status: DC | PRN

## 2014-08-26 MED ORDER — SODIUM CHLORIDE 0.9 % IJ SOLN
3.0000 mL | Freq: Two times a day (BID) | INTRAMUSCULAR | Status: DC
  Administered 2014-08-27 – 2014-08-28 (×2): 3 mL via INTRAVENOUS

## 2014-08-26 MED ORDER — INFLUENZA VAC SPLIT QUAD 0.5 ML IM SUSY
0.5000 mL | PREFILLED_SYRINGE | INTRAMUSCULAR | Status: AC
  Administered 2014-08-28: 0.5 mL via INTRAMUSCULAR
  Filled 2014-08-26: qty 0.5

## 2014-08-26 MED ORDER — FUROSEMIDE 10 MG/ML IJ SOLN
80.0000 mg | Freq: Two times a day (BID) | INTRAMUSCULAR | Status: DC
  Filled 2014-08-26 (×2): qty 8

## 2014-08-26 MED ORDER — ALBUTEROL SULFATE HFA 108 (90 BASE) MCG/ACT IN AERS: 2.0000 | INHALATION_SPRAY | RESPIRATORY_TRACT | Status: DC | PRN

## 2014-08-26 MED ORDER — ACETAMINOPHEN 325 MG PO TABS
650.0000 mg | ORAL_TABLET | Freq: Four times a day (QID) | ORAL | Status: DC | PRN
Start: 2014-08-26 — End: 2014-08-28

## 2014-08-26 MED ORDER — FUROSEMIDE 10 MG/ML IJ SOLN
80.0000 mg | Freq: Once | INTRAMUSCULAR | Status: AC
  Administered 2014-08-26: 80 mg via INTRAVENOUS
  Filled 2014-08-26: qty 8

## 2014-08-26 MED ORDER — ENOXAPARIN SODIUM 40 MG/0.4ML ~~LOC~~ SOLN: 40.0000 mg | SUBCUTANEOUS | Status: DC

## 2014-08-26 MED ORDER — SODIUM CHLORIDE 0.9 % IJ SOLN
3.0000 mL | Freq: Two times a day (BID) | INTRAMUSCULAR | Status: DC
  Administered 2014-08-26 – 2014-08-28 (×4): 3 mL via INTRAVENOUS

## 2014-08-26 MED ORDER — CARVEDILOL 6.25 MG PO TABS
6.2500 mg | ORAL_TABLET | Freq: Two times a day (BID) | ORAL | Status: DC
  Administered 2014-08-27 – 2014-08-28 (×4): 6.25 mg via ORAL
  Filled 2014-08-26 (×5): qty 1

## 2014-08-26 MED ORDER — SENNOSIDES-DOCUSATE SODIUM 8.6-50 MG PO TABS
1.0000 | ORAL_TABLET | Freq: Every evening | ORAL | Status: DC | PRN
  Filled 2014-08-26: qty 1

## 2014-08-26 MED ORDER — WARFARIN - PHARMACIST DOSING INPATIENT: Freq: Every day | Status: DC

## 2014-08-26 MED ORDER — ONDANSETRON HCL 4 MG/2ML IJ SOLN
4.0000 mg | Freq: Four times a day (QID) | INTRAMUSCULAR | Status: DC | PRN
Start: 2014-08-26 — End: 2014-08-28

## 2014-08-26 MED ORDER — SIMVASTATIN 20 MG PO TABS
20.0000 mg | ORAL_TABLET | Freq: Every day | ORAL | Status: DC
  Administered 2014-08-26 – 2014-08-27 (×2): 20 mg via ORAL
  Filled 2014-08-26 (×3): qty 1

## 2014-08-26 MED ORDER — CALCITRIOL 0.25 MCG PO CAPS
0.2500 ug | ORAL_CAPSULE | ORAL | Status: DC
  Administered 2014-08-27: 0.25 ug via ORAL
  Filled 2014-08-26: qty 1

## 2014-08-26 MED ORDER — SODIUM CHLORIDE 0.9 % IJ SOLN: 3.0000 mL | INTRAMUSCULAR | Status: DC | PRN

## 2014-08-26 NOTE — ED Provider Notes (Signed)
CSN: [id number]     Arrival date & time 08/26/14  1438 History   First MD Initiated Contact with Patient 08/26/14 1625     Chief Complaint  Patient presents with  . Leg Swelling  . Shortness of Breath     (Consider location/radiation/quality/duration/timing/severity/associated sxs/prior Treatment) Patient is a 80 y.o. female presenting with shortness of breath.  Shortness of Breath Severity:  Moderate Onset quality:  Gradual Timing:  Constant Progression:  Worsening Chronicity:  New Context: not URI   Relieved by:  Nothing Worsened by:  Nothing tried Ineffective treatments:  None tried Associated symptoms: cough (chronically, no acute worsening) and wheezing   Associated symptoms: no abdominal pain, no chest pain, no fever, no headaches, no rash, no sore throat, no sputum production and no syncope     Past Medical History  Diagnosis Date  . CHF (congestive heart failure)   . Atrial fibrillation   . COPD (chronic obstructive pulmonary disease)    Past Surgical History  Procedure Laterality Date  . Pacemaker insertion     History reviewed. No pertinent family history. History  Substance Use Topics  . Smoking status: Never Smoker   . Smokeless tobacco: Not on file  . Alcohol Use: No   OB History   Grav Para Term Preterm Abortions TAB SAB Ect Mult Living                 Review of Systems  Constitutional: Negative for fever.  HENT: Negative for sore throat.   Respiratory: Positive for cough (chronically, no acute worsening), shortness of breath and wheezing. Negative for sputum production.   Cardiovascular: Negative for chest pain and syncope.  Gastrointestinal: Negative for abdominal pain.  Skin: Negative for rash.  Neurological: Negative for headaches.  All other systems reviewed and are negative.     Allergies  Review of patient's allergies indicates no known allergies.  Home Medications   Prior to Admission medications   Medication Sig Start Date End  Date Taking? Authorizing Provider  albuterol (PROVENTIL HFA;VENTOLIN HFA) 108 (90 BASE) MCG/ACT inhaler Inhale 2 puffs into the lungs every 4 (four) hours as needed for wheezing or shortness of breath.   Yes Historical Provider, MD  aspirin EC 81 MG tablet Take 81 mg by mouth daily.   Yes Historical Provider, MD  calcitRIOL (ROCALTROL) 0.25 MCG capsule Take 0.25 mcg by mouth 4 (four) times a week. On Mondays, Tuesdays, Wednesdays, and Thursdays only 06/23/11  Yes Historical Provider, MD  carvedilol (COREG) 6.25 MG tablet Take 6.25 mg by mouth 2 (two) times daily with a meal.  06/21/11  Yes Historical Provider, MD  docusate sodium (COLACE) 100 MG capsule Take 100 mg by mouth daily as needed for mild constipation.   Yes Historical Provider, MD  ferrous sulfate 325 (65 FE) MG tablet Take 325 mg by mouth daily with breakfast.   Yes Historical Provider, MD  furosemide (LASIX) 40 MG tablet Take 40-80 mg by mouth 2 (two) times daily. 2 tablets (80 mg) in AM and 1 tablet (40 mg) in PM 06/24/11  Yes Historical Provider, MD  lisinopril (PRINIVIL,ZESTRIL) 2.5 MG tablet Take 2.5 mg by mouth daily.   Yes Historical Provider, MD  pantoprazole (PROTONIX) 40 MG tablet Take 40 mg by mouth daily.   Yes Historical Provider, MD  pravastatin (PRAVACHOL) 40 MG tablet Take 40 mg by mouth at bedtime.  08/03/11  Yes Historical Provider, MD  sertraline (ZOLOFT) 50 MG tablet Take 50 mg by mouth daily.  Yes Historical Provider, MD  tiotropium (SPIRIVA) 18 MCG inhalation capsule Place 18 mcg into inhaler and inhale daily.   Yes Historical Provider, MD  warfarin (COUMADIN) 2.5 MG tablet Take 1.25-5 mg by mouth daily. Take 2 tablets (5 mg) on Wednesdays, and take 1/2 tablet (1.25 mg) on all other days 06/05/11  Yes Historical Provider, MD   BP 102/58  Pulse 73  Temp(Src) 98 F (36.7 C) (Oral)  Resp 17  Wt 148 lb 9.6 oz (67.405 kg)  SpO2 96% Physical Exam  Vitals reviewed. Constitutional: She is oriented to person, place, and  time. She appears well-developed and well-nourished.  HENT:  Head: Normocephalic and atraumatic.  Right Ear: External ear normal.  Left Ear: External ear normal.  Eyes: Conjunctivae and EOM are normal. Pupils are equal, round, and reactive to light.  Neck: Normal range of motion. Neck supple.  Cardiovascular: Normal rate, regular rhythm, normal heart sounds and intact distal pulses.   Pulmonary/Chest: Effort normal and breath sounds normal. She has no rales.  Abdominal: Soft. Bowel sounds are normal. There is no tenderness.  Musculoskeletal: Normal range of motion.  2+ pitting edema bilaterally  Neurological: She is alert and oriented to person, place, and time.  Skin: Skin is warm and dry.    ED Course  Procedures (including critical care time) Labs Review Labs Reviewed  PRO B NATRIURETIC PEPTIDE - Abnormal; Notable for the following:    Pro B Natriuretic peptide (BNP) 64829.0 (*)    All other components within normal limits  BASIC METABOLIC PANEL - Abnormal; Notable for the following:    Chloride 95 (*)    Glucose, Bld 126 (*)    BUN 33 (*)    Creatinine, Ser 1.92 (*)    GFR calc non Af Amer 22 (*)    GFR calc Af Amer 25 (*)    All other components within normal limits  CBC - Abnormal; Notable for the following:    RDW 16.9 (*)    All other components within normal limits  PROTIME-INR - Abnormal; Notable for the following:    Prothrombin Time 35.7 (*)    INR 3.57 (*)    All other components within normal limits  I-STAT TROPOININ, ED - Abnormal; Notable for the following:    Troponin i, poc 0.09 (*)    All other components within normal limits  TROPONIN I    Imaging Review Dg Chest 2 View  08/26/2014   CLINICAL DATA:  Short breath  EXAM: CHEST  2 VIEW  COMPARISON:  08/20/2011  FINDINGS: Left-sided pacemaker with continuous leads overlies stable enlarged cardiac silhouette. Increase in perihilar airspace disease. Low lung volumes. Pneumothorax. Small effusions noted.   IMPRESSION: Increased airspace disease a perihilar distribution suggests pulmonary edema.  Small effusions.   Electronically Signed   By: Genevive Bi M.D.   On: 08/26/2014 18:28     EKG Interpretation   Date/Time:  Wednesday August 26 2014 15:02:39 EDT Ventricular Rate:  74 PR Interval:    QRS Duration: 186 QT Interval:  528 QTC Calculation: 586 R Axis:   -41 Text Interpretation:  Ventricular-paced rhythm Abnormal ECG No significant  change was found Confirmed by Mirian Mo [id number]) on 08/26/2014 5:00:43  PM      MDM   Final diagnoses:  Acute exacerbation of CHF (congestive heart failure)    80 y.o. female with pertinent PMH of CAD, CHF (EF 10%), afib on coumadin, COPD presents with increasing dyspnea, leg swelling, and other CHF  symptoms in the context of newly visiting her daughter here from [place].  The patient has a baseline history of intermittent chest pain, chronic constant dyspnea, cough, leg swelling. She's had increased activity compared to her normal baseline with more walking with family members. Due to swelling and dyspnea, the patient's daughter had her followup with her primary care doctor here who is concerned about CHF exacerbation and sent her to the emergency department. On arrival vital signs and physical exam as above, consistent with CHF exacerbation.  EKG as above. Chest x-ray demonstrated pulmonary edema, BNP 64,000, trop mildly elevated, doubt NSTEMI in context of baseline CHF.  Consulted the hospitalist for admission for diuresis and serial troponin.    1. Acute exacerbation of CHF (congestive heart failure)         Mirian Mo, MD 08/26/14 [id number]

## 2014-08-26 NOTE — ED Notes (Signed)
Pt sent here from [place]. Having sob, cough, wheezing x 2 weeks and now having moderate swelling to lower extremities. Airway is intact at triage, spo2 94%. ekg being done.

## 2014-08-26 NOTE — ED Notes (Signed)
Patient returned from X-ray 

## 2014-08-26 NOTE — Progress Notes (Signed)
ANTICOAGULATION CONSULT NOTE - Initial Consult  Pharmacy Consult for warfarin  Indication: atrial fibrillation  No Known Allergies  Patient Measurements: Weight: 148 lb 9.6 oz (67.405 kg)   Vital Signs: Temp: 98 F (36.7 C) (09/23 1505) Temp src: Oral (09/23 1505) BP: 102/58 mmHg (09/23 1915) Pulse Rate: 73 (09/23 1915)  Labs:  Recent Labs  08/26/14 1510 08/26/14 1900  HGB 12.0  --   HCT 36.4  --   PLT 198  --   LABPROT 35.7*  --   INR 3.57*  --   CREATININE 1.92*  --   TROPONINI  --  <0.30    The CrCl is unknown because both a height and weight (above a minimum accepted value) are required for this calculation.   Medical History: Past Medical History  Diagnosis Date  . CHF (congestive heart failure)   . Atrial fibrillation   . COPD (chronic obstructive pulmonary disease)   . Pacemaker   . Automatic implantable cardioverter-defibrillator in situ     Medications:  Warfarin 2.5mg  daily except  on wednesday  Assessment: 80 year old female on chronic coumadin for afib. INR above goal tonight at 3.5. No bleeding issues noted. Will hold dose tonight and follow up INR trend in am.  Goal of Therapy:  INR 2-3 Monitor platelets by anticoagulation protocol: Yes   Plan:  D/c sq lovenox with supratherapeutic INR Hold warfarin tonight Daily INR  Sheppard Coil PharmD., BCPS Clinical Pharmacist Pager [phone] 08/26/2014 8:46 PM

## 2014-08-26 NOTE — H&P (Signed)
[facility]  History and Physical Charla Criscione L. Link Snuffer, MD Pager [phone] (if 7P to 7A, page night hospitalist on [email or web address] Dines AVW:[id number] DOB: November 23, 1922 DOA: 08/26/2014  Referring physician: ED  PCP: PROVIDER NOT IN SYSTEM   Chief Complaint: CHF exac   HPI:  Pt from out of town w/ A fib, CHF (EF 10%) who presents w/ LE edema, orthopnea, cough, PND . BNP elevated in ED. [facility] consulted for admit to diuresis . Admit to tele bed. Patient lives in [place] and visiting family. Noone at bedside . She voices desire to be full code    Chart Review:  ED notes   Review of Systems:  Neg except for HPI   Past Medical History  Diagnosis Date  . CHF (congestive heart failure)   . Atrial fibrillation   . COPD (chronic obstructive pulmonary disease)     Past Surgical History  Procedure Laterality Date  . Pacemaker insertion      Social History:  reports that she has never smoked. She does not have any smokeless tobacco history on file. She reports that she does not drink alcohol or use illicit drugs.  No Known Allergies  History reviewed. No pertinent family history.   Prior to Admission medications   Medication Sig Start Date End Date Taking? Authorizing Provider  albuterol (PROVENTIL HFA;VENTOLIN HFA) 108 (90 BASE) MCG/ACT inhaler Inhale 2 puffs into the lungs every 4 (four) hours as needed for wheezing or shortness of breath.   Yes Historical Provider, MD  aspirin EC 81 MG tablet Take 81 mg by mouth daily.   Yes Historical Provider, MD  calcitRIOL (ROCALTROL) 0.25 MCG capsule Take 0.25 mcg by mouth 4 (four) times a week. On Mondays, Tuesdays, Wednesdays, and Thursdays only 06/23/11  Yes Historical Provider, MD  carvedilol (COREG) 6.25 MG tablet Take 6.25 mg by mouth 2 (two) times daily with a meal.  06/21/11  Yes Historical Provider, MD  docusate sodium (COLACE) 100 MG capsule Take 100 mg by mouth daily as needed for mild constipation.   Yes Historical Provider,  MD  ferrous sulfate 325 (65 FE) MG tablet Take 325 mg by mouth daily with breakfast.   Yes Historical Provider, MD  furosemide (LASIX) 40 MG tablet Take 40-80 mg by mouth 2 (two) times daily. 2 tablets (80 mg) in AM and 1 tablet (40 mg) in PM 06/24/11  Yes Historical Provider, MD  lisinopril (PRINIVIL,ZESTRIL) 2.5 MG tablet Take 2.5 mg by mouth daily.   Yes Historical Provider, MD  pantoprazole (PROTONIX) 40 MG tablet Take 40 mg by mouth daily.   Yes Historical Provider, MD  pravastatin (PRAVACHOL) 40 MG tablet Take 40 mg by mouth at bedtime.  08/03/11  Yes Historical Provider, MD  sertraline (ZOLOFT) 50 MG tablet Take 50 mg by mouth daily.   Yes Historical Provider, MD  tiotropium (SPIRIVA) 18 MCG inhalation capsule Place 18 mcg into inhaler and inhale daily.   Yes Historical Provider, MD  warfarin (COUMADIN) 2.5 MG tablet Take 1.25-5 mg by mouth daily. Take 2 tablets (5 mg) on Wednesdays, and take 1/2 tablet (1.25 mg) on all other days 06/05/11  Yes Historical Provider, MD   Physical Exam: Filed Vitals:   08/26/14 1735 08/26/14 1800 08/26/14 1900 08/26/14 1915  BP: 98/56 103/61 102/59 102/58  Pulse: 74 70 71 73  Temp:      TempSrc:      Resp: Weight:      SpO2: 97% 97% 97%  96%     General:  AAF in NAD   HEENT: MMM, anicteric , trachea midline, JVD present   Cardiovascular: ir/ir, reg rate, no MRG   Respiratory: rales at bases B/L   Abdomen: soft, NT , ND   Skin: warm, dry , no jaundice   Musculoskeletal: no focal deficits   Psychiatric: no anxiety , calm   Neurologic: no focal deficits   Wt Readings from Last 3 Encounters:  08/26/14 148 lb 9.6 oz (67.405 kg)  08/25/11 145 lb 6.4 oz (65.953 kg)    Labs on Admission:  Basic Metabolic Panel:  Recent Labs Lab 08/26/14 1510  NA 137  K 4.1  CL 95*  CO2 27  GLUCOSE 126*  BUN 33*  CREATININE 1.92*  CALCIUM 9.3    Liver Function Tests: No results found for this basename: AST, ALT, ALKPHOS, BILITOT,  PROT, ALBUMIN,  in the last 168 hours No results found for this basename: LIPASE, AMYLASE,  in the last 168 hours No results found for this basename: AMMONIA,  in the last 168 hours  CBC:  Recent Labs Lab 08/26/14 1510  WBC 5.9  HGB 12.0  HCT 36.4  MCV 80.5  PLT 198    Cardiac Enzymes: No results found for this basename: CKTOTAL, CKMB, CKMBINDEX, TROPONINI,  in the last 168 hours  Troponin (Point of Care Test)  Recent Labs  08/26/14 1606  TROPIPOC 0.09*    BNP (last 3 results)  Recent Labs  08/26/14 1510  PROBNP 64829.0*    CBG: No results found for this basename: GLUCAP,  in the last 168 hours   Radiological Exams on Admission: Dg Chest 2 View  08/26/2014   CLINICAL DATA:  Short breath  EXAM: CHEST  2 VIEW  COMPARISON:  08/20/2011  FINDINGS: Left-sided pacemaker with continuous leads overlies stable enlarged cardiac silhouette. Increase in perihilar airspace disease. Low lung volumes. Pneumothorax. Small effusions noted.  IMPRESSION: Increased airspace disease a perihilar distribution suggests pulmonary edema.  Small effusions.   Electronically Signed   By: Genevive Bi M.D.   On: 08/26/2014 18:28      Active Problems:   CHF exacerbation   Assessment/Plan 1. CHF exacerbation - lasix  IV BID starting tonight and continue until Cr bump. O2 as needed. Home meds continued otherwise. I/O. Daily weights  2. A fib - warfarin per pharm. Home meds . Tele  3. Trop leak - problem in setting of cardiac stress w/ patient w/ EF 10% and now having CHF exac. Will trend . Cont home ASA    Code Status: full code  Family Communication: none Disposition Plan/Anticipated LOS: 3-5 days   Time spent: 40 minutes   Alysia Penna, MD  Internal Medicine Pager [phone] If 7PM-7AM, please contact night-coverage at [email or web address], password [facility] 08/26/2014, 7:35 PM

## 2014-08-26 NOTE — ED Notes (Signed)
Report attempted 

## 2014-08-26 NOTE — ED Notes (Signed)
Patient transported to X-ray 

## 2014-08-26 NOTE — ED Notes (Signed)
Reported labs to Nurse Janelle in Triage.

## 2014-08-26 NOTE — Progress Notes (Signed)
Admitted pt to rm 3E30 from ED, oriented to room, call bell placed within reach, admission orders carried out. Family at bedside. Will continue to monitor.  08/26/14 2024  Vitals  Temp 98.4 F (36.9 C)  Temp src Oral  BP 93/64 mmHg  BP Location Right arm  BP Method Automatic  Patient Position (if appropriate) Sitting  Pulse Rate 75  Pulse Rate Source Dinamap  Resp 18  Oxygen Therapy  SpO2 99 %  O2 Device None (Room air)  Height and Weight  Height  (1.549 m)  Weight 66.452 kg (146 lb 8 oz) (scale B)  Type of Scale Used Standing  BSA (Calculated - sq m) 1.69 sq meters  BMI (Calculated) 27.7  Weight in (lb) to have BMI = 25 132

## 2014-08-27 DIAGNOSIS — I10 Essential (primary) hypertension: Secondary | ICD-10-CM

## 2014-08-27 DIAGNOSIS — R0602 Shortness of breath: Secondary | ICD-10-CM

## 2014-08-27 DIAGNOSIS — N184 Chronic kidney disease, stage 4 (severe): Secondary | ICD-10-CM

## 2014-08-27 DIAGNOSIS — I4891 Unspecified atrial fibrillation: Secondary | ICD-10-CM

## 2014-08-27 LAB — BASIC METABOLIC PANEL
Anion gap: 15 (ref 5–15)
Anion gap: 13 (ref 5–15)
BUN: 33 mg/dL — ABNORMAL HIGH (ref 6–23)
BUN: 32 mg/dL — AB (ref 6–23)
CALCIUM: 9.1 mg/dL (ref 8.4–10.5)
CO2: 24 mEq/L (ref 19–32)
CO2: 28 mEq/L (ref 19–32)
CREATININE: 1.89 mg/dL — AB (ref 0.50–1.10)
CREATININE: 1.95 mg/dL — AB (ref 0.50–1.10)
Calcium: 9.3 mg/dL (ref 8.4–10.5)
Chloride: 98 mEq/L (ref 96–112)
Chloride: 97 mEq/L (ref 96–112)
GFR calc Af Amer: 25 mL/min — ABNORMAL LOW (ref >90)
GFR, EST AFRICAN AMERICAN: 26 mL/min — AB (ref >90)
GFR, EST NON AFRICAN AMERICAN: 22 mL/min — AB (ref >90)
GFR, EST NON AFRICAN AMERICAN: 21 mL/min — AB (ref >90)
GLUCOSE: 104 mg/dL — AB (ref 70–99)
Glucose, Bld: 108 mg/dL — ABNORMAL HIGH (ref 70–99)
POTASSIUM: 4 meq/L (ref 3.7–5.3)
Potassium: 3.7 mEq/L (ref 3.7–5.3)
Sodium: 137 mEq/L (ref 137–147)
Sodium: 138 mEq/L (ref 137–147)

## 2014-08-27 LAB — PROTIME-INR
INR: 3.42 — ABNORMAL HIGH (ref 0.00–1.49)
Prothrombin Time: 34.5 seconds — ABNORMAL HIGH (ref 11.6–15.2)

## 2014-08-27 LAB — CBC
HEMATOCRIT: 34.7 % — AB (ref 36.0–46.0)
Hemoglobin: 11.1 g/dL — ABNORMAL LOW (ref 12.0–15.0)
MCH: 26.5 pg (ref 26.0–34.0)
MCHC: 32 g/dL (ref 30.0–36.0)
MCV: 82.8 fL (ref 78.0–100.0)
PLATELETS: 185 10*3/uL (ref 150–400)
RBC: 4.19 MIL/uL (ref 3.87–5.11)
RDW: 17.1 % — AB (ref 11.5–15.5)
WBC: 6.1 10*3/uL (ref 4.0–10.5)

## 2014-08-27 LAB — TROPONIN I

## 2014-08-27 MED ORDER — FUROSEMIDE 10 MG/ML IJ SOLN
60.0000 mg | Freq: Two times a day (BID) | INTRAMUSCULAR | Status: DC
  Administered 2014-08-27 – 2014-08-28 (×3): 60 mg via INTRAVENOUS
  Filled 2014-08-27 (×2): qty 6

## 2014-08-27 NOTE — Care Management Note (Signed)
    Page 1 of 1   08/27/2014     3:01:50 PM CARE MANAGEMENT NOTE 08/27/2014  Patient:  Katie Carlson, Katie Carlson   Account Number:  [id number]  Date Initiated:  08/27/2014  Documentation initiated by:  Oletta Cohn  Subjective/Objective Assessment:   80 yo female PMHx:  A fib, CHF (EF 10%) who presents w/ LE edema, orthopnea, cough, PND.//Patient lives in [place] and visiting family.     Action/Plan:   Lasix  IV BID starting tonight and continue until Cr bump. O2 as needed. Home meds continued otherwise. I/O. Daily weights.// Access for disposition.   Anticipated DC Date:  08/31/2014   Anticipated DC Plan:  HOME/SELF CARE      DC Planning Services  CM consult      Choice offered to / List presented to:             Status of service:  In process, will continue to follow Medicare Important Message given?   (If response is "NO", the following Medicare IM given date fields will be blank) Date Medicare IM given:   Medicare IM given by:   Date Additional Medicare IM given:   Additional Medicare IM given by:    Discharge Disposition:    Per UR Regulation:  Reviewed for med. necessity/level of care/duration of stay  If discussed at Long Length of Stay Meetings, dates discussed:    Comments:

## 2014-08-27 NOTE — Progress Notes (Signed)
Heart Failure Navigator Consult Note  Presentation: Katie Carlson is from out of town w/ A fib, CHF (EF 10%) who presents w/ LE edema, orthopnea, cough, PND . BNP elevated in ED.   Past Medical History  Diagnosis Date  . CHF (congestive heart failure)   . Atrial fibrillation   . COPD (chronic obstructive pulmonary disease)   . Pacemaker   . Automatic implantable cardioverter-defibrillator in situ     History   Social History  . Marital Status: Widowed    Spouse Name: N/A    Number of Children: N/A  . Years of Education: N/A   Social History Main Topics  . Smoking status: Never Smoker   . Smokeless tobacco: None  . Alcohol Use: No  . Drug Use: No  . Sexual Activity: Not Currently   Other Topics Concern  . None   Social History Narrative  . None    ECHO: none on file--EF reported at 10%  BNP    Component Value Date/Time   PROBNP 64829.0* 08/26/2014 1510    Education Assessment and Provision:  Detailed education and instructions provided on heart failure disease management including the following:  Signs and symptoms of Heart Failure When to call the physician Importance of daily weights Low sodium diet Fluid restriction Medication management Anticipated future follow-up appointments  Patient education given on each of the above topics.  Patient acknowledges understanding and acceptance of all instructions.  She is visiting her daughter locally and is from [place].  She admits that she has known of her HF for some time and manages well at home.  Patient says that she weighs daily at home and her "dry weight" is 144 or 145.  She was 148lbs on admission.  I reinforced education with her again and she acknowledges understanding of all topics.  We discussed briefly being especially aware of her symptoms while away from home--continuing her usual routine as much as able (including daily weights and low sodium diet).   Education Materials:  "Living Better With  Heart Failure" Booklet, Daily Weight Tracker Tool    High Risk Criteria for Readmission and/or Poor Patient Outcomes   EF <30%- Ys -10%  2 or more admissions in 6 months- No--lives in [place]. Only visiting daughter who lives locally  Difficult social situation- No  Demonstrates medication noncompliance- No    Barriers of Care:  Being out of her routine (visiting family locally), Admits to not weighing at her daughters house.  Discharge Planning:   She plans to return to [place] as soon as she can.  She will follow up with her [place].

## 2014-08-27 NOTE — Progress Notes (Signed)
ANTICOAGULATION CONSULT NOTE - Follow up Pharmacy Consult for warfarin  Indication: atrial fibrillation  No Known Allergies  Patient Measurements: Height:  (154.9 cm) Weight: 146 lb 8 oz (66.452 kg) (scale B) IBW/kg (Calculated) : 47.8   Vital Signs: BP: 103/63 mmHg (09/24 1203) Pulse Rate: 74 (09/24 1203)  Labs:  Recent Labs  08/26/14 1510 08/26/14 1900 08/27/14 0207 08/27/14 0715  HGB 12.0  --  11.1*  --   HCT 36.4  --  34.7*  --   PLT 198  --  185  --   LABPROT 35.7*  --  34.5*  --   INR 3.57*  --  3.42*  --   CREATININE 1.92*  --  1.89* 1.95*  TROPONINI  --  <0.30 <0.30 <0.30    Estimated Creatinine Clearance: 16.7 ml/min (by C-G formula based on Cr of 1.95).   Medical History: Past Medical History  Diagnosis Date  . CHF (congestive heart failure)   . Atrial fibrillation   . COPD (chronic obstructive pulmonary disease)   . Pacemaker   . Automatic implantable cardioverter-defibrillator in situ     Medications:  Warfarin 1.25mg  daily except  on wednesday  Assessment: 80 year old female on chronic coumadin for afib. INR remains above goal this AM at 3.42; slight decrease from 3.57 last night. PTA dose: 1.25 mg daily except  qWed. Reported that the patient took her coumadin prior to admission yesterday.  No bleeding issues noted.   She is visiting her daughter locally and is from [place]   Goal of Therapy:  INR 2-3 Monitor platelets by anticoagulation protocol: Yes   Plan:  Hold warfarin tonight Daily INR  Noah Delaine, RPh Clinical Pharmacist Pager: [phone]  08/27/2014 12:25 PM

## 2014-08-27 NOTE — Progress Notes (Signed)
[facility] PROGRESS NOTE Interim History: 80 y/o totally functional, with hx of systolic heart failure, atrial fibrillation and COPD; came to ED with complaints of SOB and LE swelling. Patient reported sodium diet indiscretion (road food, as she drove with family from [place] and lot of going out for dinner trips, while enjoy her visit to her daughter.   Filed Weights   08/26/14 1505 08/26/14 2024  Weight: 67.405 kg (148 lb 9.6 oz) 66.452 kg (146 lb 8 oz)        Intake/Output Summary (Last 24 hours) at 08/27/14 1755 Last data filed at 08/27/14 1437  Gross per 24 hour  Intake    960 ml  Output    900 ml  Net     60 ml     Assessment/Plan: 1-Acute on chronic systolic CHF exacerbation: most likely secondary to diet indiscretion -continue daily weight, strict I's and O's and low sodium diet -continue IV fluids -follow clinical response -continue b-blocker -continue low dose lisinopril  -close follow up of renal function  2-atrial fibrillation: rate controlled -continue coumadin (dose per pharmacy)  3-HLD: continue statins  4-CKD (stage 4 at baseline): stable. -will monitor with diuresis  5-COPD: stable. Continue home inhaler regimen. No wheezing.    Code Status: Full Family Communication: no family at bedside Disposition Plan: home when volume status control and no longer SOB   Consultants:  None   Procedures: ECHO: patient reports most recent 2-D echo with EF 10%   Antibiotics:  None   HPI/Subjective: Breathing easier, no CP, no fever and no palpitations.  Objective: Filed Vitals:   08/26/14 2024 08/27/14 0821 08/27/14 1203 08/27/14 1432  BP: 93/64  103/63 104/68  Pulse: 75  74 74  Temp: 98.4 F (36.9 C)   97.8 F (36.6 C)  TempSrc: Oral   Oral  Resp: 18     Height:  (1.549 m)     Weight: 66.452 kg (146 lb 8 oz)     SpO2: 99% 91%  98%     Exam:  General: Alert, awake, oriented x3, in no acute distress. Breathing a lot  easier this morning. No CP or palpitations HEENT: No bruits, no goiter. No JVd appreciated Heart: Regular rate, positive SEM, no rubs or gallops Lungs: no wheezing, decrease BS at bases bilaterally Abdomen: Soft, nontender, nondistended, positive bowel sounds.  Neuro: Grossly intact, nonfocal.   Data Reviewed: Basic Metabolic Panel:  Recent Labs Lab 08/26/14 1510 08/27/14 0207 08/27/14 0715  NA 137 137 138  K 4.1 3.7 4.0  CL 95* 98 97  CO2 GLUCOSE 126* 108* 104*  BUN 33* 33* 32*  CREATININE 1.92* 1.89* 1.95*  CALCIUM 9.3 9.1 9.3   CBC:  Recent Labs Lab 08/26/14 1510 08/27/14 0207  WBC 5.9 6.1  HGB 12.0 11.1*  HCT 36.4 34.7*  MCV 80.5 82.8  PLT 198 185   Cardiac Enzymes:  Recent Labs Lab 08/26/14 1900 08/27/14 0207 08/27/14 0715  TROPONINI <0.30 <0.30 <0.30   BNP (last 3 results)  Recent Labs  08/26/14 1510  PROBNP 64829.0*    Studies: Dg Chest 2 View  08/26/2014   CLINICAL DATA:  Short breath  EXAM: CHEST  2 VIEW  COMPARISON:  08/20/2011  FINDINGS: Left-sided pacemaker with continuous leads overlies stable enlarged cardiac silhouette. Increase in perihilar airspace disease. Low lung volumes. Pneumothorax. Small effusions noted.  IMPRESSION: Increased airspace disease a perihilar distribution suggests pulmonary edema.  Small effusions.   Electronically  Signed   By: Genevive Bi M.D.   On: 08/26/2014 18:28    Scheduled Meds: . aspirin EC  81 mg Oral Daily  . calcitRIOL  0.25 mcg Oral Once per day on Mon Tue Wed Thu  . carvedilol  6.25 mg Oral BID WC  . ferrous sulfate  325 mg Oral Q breakfast  . furosemide  60 mg Intravenous BID  . Influenza vac split quadrivalent PF  0.5 mL Intramuscular Tomorrow-1000  . lisinopril  2.5 mg Oral Daily  . pantoprazole  40 mg Oral Daily  . sertraline  50 mg Oral Daily  . simvastatin  20 mg Oral QHS  . sodium chloride  3 mL Intravenous Q12H  . sodium chloride  3 mL Intravenous Q12H  . tiotropium  18 mcg  Inhalation Daily  . Warfarin - Pharmacist Dosing Inpatient   Does not apply q1800   Continuous Infusions:   Time > 30 minutes  Vassie Loll  [facility] Pager [phone]. If 8PM-8AM, please contact night-coverage at [email or web address], password [facility] 08/27/2014, 5:55 PM  LOS: 1 day     **Disclaimer: This note may have been dictated with voice recognition software. Similar sounding words can inadvertently be transcribed and this note may contain transcription errors which may not have been corrected upon publication of note.**

## 2014-08-27 NOTE — Discharge Instructions (Signed)
Information on my medicine - Coumadin®   (Warfarin) ° °This medication education was reviewed with me or my healthcare representative as part of my discharge preparation.  The pharmacist that spoke with me during my hospital stay was:  Jeanee Fabre Prescott, RPH ° °Why was Coumadin prescribed for you? °Coumadin was prescribed for you because you have a blood clot or a medical condition that can cause an increased risk of forming blood clots. Blood clots can cause serious health problems by blocking the flow of blood to the heart, lung, or brain. Coumadin can prevent harmful blood clots from forming. °As a reminder your indication for Coumadin is:   Select from menu ° °What test will check on my response to Coumadin? °While on Coumadin (warfarin) you will need to have an INR test regularly to ensure that your dose is keeping you in the desired range. The INR (international normalized ratio) number is calculated from the result of the laboratory test called prothrombin time (PT). ° °If an INR APPOINTMENT HAS NOT ALREADY BEEN MADE FOR YOU please schedule an appointment to have this lab work done by your health care provider within 7 days. °Your INR goal is usually a number between:  2 to 3 or your provider may give you a more narrow range like 2-2.5.  Ask your health care provider during an office visit what your goal INR is. ° °What  do you need to  know  About  COUMADIN? °Take Coumadin (warfarin) exactly as prescribed by your healthcare provider about the same time each day.  DO NOT stop taking without talking to the doctor who prescribed the medication.  Stopping without other blood clot prevention medication to take the place of Coumadin may increase your risk of developing a new clot or stroke.  Get refills before you run out. ° °What do you do if you miss a dose? °If you miss a dose, take it as soon as you remember on the same day then continue your regularly scheduled regimen the next day.  Do not take two doses  of Coumadin at the same time. ° °Important Safety Information °A possible side effect of Coumadin (Warfarin) is an increased risk of bleeding. You should call your healthcare provider right away if you experience any of the following: °  Bleeding from an injury or your nose that does not stop. °  Unusual colored urine (red or dark brown) or unusual colored stools (red or black). °  Unusual bruising for unknown reasons. °  A serious fall or if you hit your head (even if there is no bleeding). ° °Some foods or medicines interact with Coumadin® (warfarin) and might alter your response to warfarin. To help avoid this: °  Eat a balanced diet, maintaining a consistent amount of Vitamin K. °  Notify your provider about major diet changes you plan to make. °  Avoid alcohol or limit your intake to 1 drink for women and 2 drinks for men per day. °(1 drink is 5 oz. wine, 12 oz. beer, or 1.5 oz. liquor.) ° °Make sure that ANY health care provider who prescribes medication for you knows that you are taking Coumadin (warfarin).  Also make sure the healthcare provider who is monitoring your Coumadin knows when you have started a new medication including herbals and non-prescription products. ° °Coumadin® (Warfarin)  Major Drug Interactions  °Increased Warfarin Effect Decreased Warfarin Effect  °Alcohol (large quantities) °Antibiotics (esp. Septra/Bactrim, Flagyl, Cipro) °Amiodarone (Cordarone) °Aspirin (ASA) °Cimetidine (Tagamet) °  Megestrol (Megace) °NSAIDs (ibuprofen, naproxen, etc.) °Piroxicam (Feldene) °Propafenone (Rythmol SR) °Propranolol (Inderal) °Isoniazid (INH) °Posaconazole (Noxafil) Barbiturates (Phenobarbital) °Carbamazepine (Tegretol) °Chlordiazepoxide (Librium) °Cholestyramine (Questran) °Griseofulvin °Oral Contraceptives °Rifampin °Sucralfate (Carafate) °Vitamin K  ° °Coumadin® (Warfarin) Major Herbal Interactions  °Increased Warfarin Effect Decreased Warfarin Effect  °Garlic °Ginseng °Ginkgo biloba Coenzyme  Q10 °Green tea °St. John’s wort   ° °Coumadin® (Warfarin) FOOD Interactions  °Eat a consistent number of servings per week of foods HIGH in Vitamin K °(1 serving = ½ cup)  °Collards (cooked, or boiled & drained) °Kale (cooked, or boiled & drained) °Mustard greens (cooked, or boiled & drained) °Parsley *serving size only = ¼ cup °Spinach (cooked, or boiled & drained) °Swiss chard (cooked, or boiled & drained) °Turnip greens (cooked, or boiled & drained)  °Eat a consistent number of servings per week of foods MEDIUM-HIGH in Vitamin K °(1 serving = 1 cup)  °Asparagus (cooked, or boiled & drained) °Broccoli (cooked, boiled & drained, or raw & chopped) °Brussel sprouts (cooked, or boiled & drained) *serving size only = ½ cup °Lettuce, raw (green leaf, endive, romaine) °Spinach, raw °Turnip greens, raw & chopped  ° °These websites have more information on Coumadin (warfarin):  www.coumadin.com; °www.ahrq.gov/consumer/coumadin.htm; ° ° °

## 2014-08-28 DIAGNOSIS — I5023 Acute on chronic systolic (congestive) heart failure: Secondary | ICD-10-CM | POA: Diagnosis not present

## 2014-08-28 LAB — BASIC METABOLIC PANEL
Anion gap: 17 — ABNORMAL HIGH (ref 5–15)
BUN: 33 mg/dL — ABNORMAL HIGH (ref 6–23)
CHLORIDE: 98 meq/L (ref 96–112)
CO2: 27 meq/L (ref 19–32)
CREATININE: 1.96 mg/dL — AB (ref 0.50–1.10)
Calcium: 9.1 mg/dL (ref 8.4–10.5)
GFR calc Af Amer: 25 mL/min — ABNORMAL LOW (ref >90)
GFR calc non Af Amer: 21 mL/min — ABNORMAL LOW (ref >90)
Glucose, Bld: 121 mg/dL — ABNORMAL HIGH (ref 70–99)
Potassium: 4 mEq/L (ref 3.7–5.3)
Sodium: 142 mEq/L (ref 137–147)

## 2014-08-28 LAB — PROTIME-INR
INR: 2.84 — AB (ref 0.00–1.49)
Prothrombin Time: 29.8 seconds — ABNORMAL HIGH (ref 11.6–15.2)

## 2014-08-28 MED ORDER — FUROSEMIDE 10 MG/ML IJ SOLN
INTRAMUSCULAR | Status: AC
  Filled 2014-08-28: qty 4

## 2014-08-28 MED ORDER — WARFARIN 1.25 MG HALF TABLET
1.2500 mg | ORAL_TABLET | Freq: Once | ORAL | Status: AC
  Administered 2014-08-28: 1.25 mg via ORAL
  Filled 2014-08-28: qty 1

## 2014-08-28 MED ORDER — DIPHENHYDRAMINE HCL 12.5 MG/5ML PO ELIX
12.5000 mg | ORAL_SOLUTION | Freq: Once | ORAL | Status: AC
  Administered 2014-08-28: 12.5 mg via ORAL
  Filled 2014-08-28: qty 5

## 2014-08-28 NOTE — Progress Notes (Signed)
ANTICOAGULATION CONSULT NOTE - Follow up Pharmacy Consult for warfarin  Indication: atrial fibrillation  No Known Allergies  Patient Measurements: Height:  (154.9 cm) Weight: 145 lb 8 oz (65.998 kg) (b scale) IBW/kg (Calculated) : 47.8   Vital Signs: Temp: 98.1 F (36.7 C) (09/25 0807) Temp src: Oral (09/25 0807) BP: 110/81 mmHg (09/25 0807) Pulse Rate: 72 (09/25 0807)  Labs:  Recent Labs  08/26/14 1510 08/26/14 1900 08/27/14 0207 08/27/14 0715 08/28/14 0442  HGB 12.0  --  11.1*  --   --   HCT 36.4  --  34.7*  --   --   PLT 198  --  185  --   --   LABPROT 35.7*  --  34.5*  --  29.8*  INR 3.57*  --  3.42*  --  2.84*  CREATININE 1.92*  --  1.89* 1.95* 1.96*  TROPONINI  --  <0.30 <0.30 <0.30  --     Estimated Creatinine Clearance: 16.6 ml/min (by C-G formula based on Cr of 1.96).    Medications:  Warfarin 1.25mg  daily except 3.75 mg on Wednesday  - dose confirmed with patient 9/25  Assessment: 80 year old female on chronic coumadin for afib. PTA dose: 1.25 mg daily except 3.75 mg qWed. Reported that the patient took her coumadin on 9/23 at 0830 am prior to admission.  Coumadin was held 9/24 due to INR 3.42.  Now INR is therapeutic at 2.84.  No bleeding reported. She is visiting her daughter locally and is from [place]   Goal of Therapy:  INR 2-3 Monitor platelets by anticoagulation protocol: Yes   Plan:  Coumadin 1.25mg  today as per home dose Daily INR Herby Abraham, Pharm.D. [phone] 08/28/2014 12:21 PM

## 2014-08-28 NOTE — Progress Notes (Signed)
Patient c/o not being able to sleep and is requesting medication to help her relax and get some rest. NP Maren Reamer paged and gave an order for 12.5 mg of liquid Benadryl to be adminsitered x 1 now. Medication administered as per MD order. Patient is maintained on telemetry and is V-paced with a heart rate of 70. Will continue to monitor.  Sharlene Dory, RN

## 2014-08-28 NOTE — Discharge Summary (Signed)
Physician Discharge Summary  Katie Carlson ION:[id number] DOB: 21-May-1922 DOA: 08/26/2014  PCP: PROVIDER NOT IN SYSTEM  Admit date: 08/26/2014 Discharge date: 08/28/2014  Time spent: >30 minutes  Recommendations for Outpatient Follow-up:  1. BMET to follow electrolytes and renal function 2. Reassess volume level and if needed adjust dose of lasix  3. Follow up with cardiologist in 1 week  BNP    Component Value Date/Time   PROBNP 64829.0* 08/26/2014 1510   Filed Weights   08/26/14 1505 08/26/14 2024 08/28/14 0551  Weight: 68 kg (148 lb 9.6 oz) 66.452 kg (146 lb 8 oz) 65 kg (143 lb 8 oz)     Discharge Diagnoses:  Acute on chronic systolic CHF exacerbation COPD Chronic atrial fibrillation Long term use of anticoagulation HLD HTN CKD stage 4  Discharge Condition: stable and improved. Will discharge home. Patient instructed to follow with cardiologist in 1 week and to follow a low sodium diet.  Diet recommendation: low sodium diet   History of present illness:  80 y/o very functional; with hx of systolic heart failure, atrial fibrillation and COPD; came to ED with complaints of SOB and LE swelling. Patient reported sodium diet indiscretion (road food, as she drove with family from [place] and a lot of going out for dinner trips, while enjoying her visit to daughter in [place]).    Hospital Course:  1-Acute on chronic systolic CHF exacerbation: most likely secondary to diet indiscretion  -continue daily weight, fluid restrictions and low sodium diet  -continue current dose of lasix (80 mg am and 40 mg PM) -continue b-blocker  -continue low dose lisinopril  -last 2 D echo recently done and demonstrated EF 10-20% according to patient  2-atrial fibrillation: rate controlled  -continue coumadin and b-blocker -INR therapeutic   3-HLD: continue statins   4-CKD (stage 4 at baseline): stable.  -will continue calcitriol -close follow up of BMET as an outpatient  5-COPD:  stable. Continue home inhaler regimen. No wheezing.    Procedures: See below for x-ray reports  Consultations:  None   Discharge Exam: Filed Vitals:   08/28/14 1300  BP: 100/70  Pulse: 78  Temp: 98.1 F (36.7 C)  Resp: 18   General: Alert, awake, oriented x3, in no acute distress. Breathing a lot easier this morning. No CP or palpitations  HEENT: No bruits, no goiter. No JVD appreciated  Heart: Regular rate, positive SEM, no rubs or gallops  Lungs: no wheezing, im[proved air movement; no crackles  Abdomen: Soft, nontender, nondistended, positive bowel sounds.  Neuro: Grossly intact, nonfocal.   Discharge Instructions  Discharge Instructions   Diet - low sodium heart healthy    Complete by:  As directed      Discharge instructions    Complete by:  As directed   Take medications as prescribed Check your weight on daily basis (red flag to contact cardiologist you gain more than 3 pounds overnight or more than 5 pounds in 1 week) Low sodium diet (less than 2 gram daily) Restrict your fluid intake to 2L of fluids daily Follow up with cardiologist in 1 week            Medication List         albuterol 108 (90 BASE) MCG/ACT inhaler  Commonly known as:  PROVENTIL HFA;VENTOLIN HFA  Inhale 2 puffs into the lungs every 4 (four) hours as needed for wheezing or shortness of breath.     aspirin EC 81 MG tablet  Take 81 mg  by mouth daily.     calcitRIOL 0.25 MCG capsule  Commonly known as:  ROCALTROL  Take 0.25 mcg by mouth 4 (four) times a week. On Mondays, Tuesdays, Wednesdays, and Thursdays only     carvedilol 6.25 MG tablet  Commonly known as:  COREG  Take 6.25 mg by mouth 2 (two) times daily with a meal.     docusate sodium 100 MG capsule  Commonly known as:  COLACE  Take 100 mg by mouth daily as needed for mild constipation.     ferrous sulfate 325 (65 FE) MG tablet  Take 325 mg by mouth daily with breakfast.     furosemide 40 MG tablet  Commonly known as:   LASIX  Take 40-80 mg by mouth 2 (two) times daily. 2 tablets (80 mg) in AM and 1 tablet (40 mg) in PM     lisinopril 2.5 MG tablet  Commonly known as:  PRINIVIL,ZESTRIL  Take 2.5 mg by mouth daily.     pantoprazole 40 MG tablet  Commonly known as:  PROTONIX  Take 40 mg by mouth daily.     pravastatin 40 MG tablet  Commonly known as:  PRAVACHOL  Take 40 mg by mouth at bedtime.     sertraline 50 MG tablet  Commonly known as:  ZOLOFT  Take 50 mg by mouth daily.     tiotropium 18 MCG inhalation capsule  Commonly known as:  SPIRIVA  Place 18 mcg into inhaler and inhale daily.     warfarin 2.5 MG tablet  Commonly known as:  COUMADIN  Take 1.25-3.75 mg by mouth daily. Takes 1.5 tablets (3.75 mg) on Wednesdays, and take 1/2 tablet (1.25 mg) on all other days       No Known Allergies    The results of significant diagnostics from this hospitalization (including imaging, microbiology, ancillary and laboratory) are listed below for reference.    Significant Diagnostic Studies: Dg Chest 2 View  08/26/2014   CLINICAL DATA:  Short breath  EXAM: CHEST  2 VIEW  COMPARISON:  08/20/2011  FINDINGS: Left-sided pacemaker with continuous leads overlies stable enlarged cardiac silhouette. Increase in perihilar airspace disease. Low lung volumes. Pneumothorax. Small effusions noted.  IMPRESSION: Increased airspace disease a perihilar distribution suggests pulmonary edema.  Small effusions.   Electronically Signed   By: Genevive Bi M.D.   On: 08/26/2014 18:28   Labs: Basic Metabolic Panel:  Recent Labs Lab 08/26/14 1510 08/27/14 0207 08/27/14 0715 08/28/14 0442  NA 137 137 138 142  K 4.1 3.7 4.0 4.0  CL 95* 98 97 98  CO2 GLUCOSE 126* 108* 104* 121*  BUN 33* 33* 32* 33*  CREATININE 1.92* 1.89* 1.95* 1.96*  CALCIUM 9.3 9.1 9.3 9.1   CBC:  Recent Labs Lab 08/26/14 1510 08/27/14 0207  WBC 5.9 6.1  HGB 12.0 11.1*  HCT 36.4 34.7*  MCV 80.5 82.8  PLT 198 185    Cardiac Enzymes:  Recent Labs Lab 08/26/14 1900 08/27/14 0207 08/27/14 0715  TROPONINI <0.30 <0.30 <0.30   BNP: BNP (last 3 results)  Recent Labs  08/26/14 1510  PROBNP 64829.0*    Signed:  Vassie Loll  [facility] 08/28/2014, 4:13 PM

## 2014-11-16 ENCOUNTER — Inpatient Hospital Stay (HOSPITAL_COMMUNITY)
Admission: AD | Admit: 2014-11-16 | Discharge: 2014-11-24 | DRG: 291 | Disposition: A | Discharge status: Hospice | Payer: Medicare Other | Source: Ambulatory Visit | Attending: Cardiology | Admitting: Cardiology

## 2014-11-16 ENCOUNTER — Other Ambulatory Visit: Payer: Self-pay | Admitting: Cardiology

## 2014-11-16 DIAGNOSIS — I255 Ischemic cardiomyopathy: Secondary | ICD-10-CM | POA: Diagnosis present

## 2014-11-16 DIAGNOSIS — I252 Old myocardial infarction: Secondary | ICD-10-CM | POA: Diagnosis not present

## 2014-11-16 DIAGNOSIS — I2510 Atherosclerotic heart disease of native coronary artery without angina pectoris: Secondary | ICD-10-CM | POA: Diagnosis present

## 2014-11-16 DIAGNOSIS — I959 Hypotension, unspecified: Secondary | ICD-10-CM | POA: Diagnosis present

## 2014-11-16 DIAGNOSIS — I495 Sick sinus syndrome: Secondary | ICD-10-CM | POA: Diagnosis present

## 2014-11-16 DIAGNOSIS — Z515 Encounter for palliative care: Secondary | ICD-10-CM | POA: Diagnosis not present

## 2014-11-16 DIAGNOSIS — I480 Paroxysmal atrial fibrillation: Secondary | ICD-10-CM | POA: Diagnosis present

## 2014-11-16 DIAGNOSIS — N184 Chronic kidney disease, stage 4 (severe): Secondary | ICD-10-CM | POA: Diagnosis present

## 2014-11-16 DIAGNOSIS — R627 Adult failure to thrive: Secondary | ICD-10-CM | POA: Diagnosis present

## 2014-11-16 DIAGNOSIS — Z7901 Long term (current) use of anticoagulants: Secondary | ICD-10-CM

## 2014-11-16 DIAGNOSIS — Z79899 Other long term (current) drug therapy: Secondary | ICD-10-CM | POA: Diagnosis not present

## 2014-11-16 DIAGNOSIS — L98419 Non-pressure chronic ulcer of buttock with unspecified severity: Secondary | ICD-10-CM | POA: Diagnosis present

## 2014-11-16 DIAGNOSIS — E43 Unspecified severe protein-calorie malnutrition: Secondary | ICD-10-CM | POA: Diagnosis present

## 2014-11-16 DIAGNOSIS — R410 Disorientation, unspecified: Secondary | ICD-10-CM | POA: Diagnosis present

## 2014-11-16 DIAGNOSIS — I509 Heart failure, unspecified: Secondary | ICD-10-CM

## 2014-11-16 DIAGNOSIS — Z66 Do not resuscitate: Secondary | ICD-10-CM | POA: Diagnosis present

## 2014-11-16 DIAGNOSIS — J449 Chronic obstructive pulmonary disease, unspecified: Secondary | ICD-10-CM | POA: Diagnosis present

## 2014-11-16 DIAGNOSIS — Z6823 Body mass index (BMI) 23.0-23.9, adult: Secondary | ICD-10-CM

## 2014-11-16 DIAGNOSIS — R0600 Dyspnea, unspecified: Secondary | ICD-10-CM | POA: Diagnosis not present

## 2014-11-16 DIAGNOSIS — I5043 Acute on chronic combined systolic (congestive) and diastolic (congestive) heart failure: Secondary | ICD-10-CM | POA: Diagnosis present

## 2014-11-16 DIAGNOSIS — I5041 Acute combined systolic (congestive) and diastolic (congestive) heart failure: Secondary | ICD-10-CM | POA: Diagnosis present

## 2014-11-16 DIAGNOSIS — Z950 Presence of cardiac pacemaker: Secondary | ICD-10-CM

## 2014-11-16 LAB — COMPREHENSIVE METABOLIC PANEL
ALT: 32 U/L (ref 0–35)
AST: 29 U/L (ref 0–37)
Albumin: 3.1 g/dL — ABNORMAL LOW (ref 3.5–5.2)
Alkaline Phosphatase: 92 U/L (ref 39–117)
Anion gap: 17 — ABNORMAL HIGH (ref 5–15)
BILIRUBIN TOTAL: 2.5 mg/dL — AB (ref 0.3–1.2)
BUN: 37 mg/dL — ABNORMAL HIGH (ref 6–23)
CHLORIDE: 91 meq/L — AB (ref 96–112)
CO2: 25 meq/L (ref 19–32)
Calcium: 9.4 mg/dL (ref 8.4–10.5)
Creatinine, Ser: 1.95 mg/dL — ABNORMAL HIGH (ref 0.50–1.10)
GFR calc Af Amer: 25 mL/min — ABNORMAL LOW (ref >90)
GFR calc non Af Amer: 21 mL/min — ABNORMAL LOW (ref >90)
Glucose, Bld: 133 mg/dL — ABNORMAL HIGH (ref 70–99)
Potassium: 3.5 mEq/L — ABNORMAL LOW (ref 3.7–5.3)
Sodium: 133 mEq/L — ABNORMAL LOW (ref 137–147)
Total Protein: 6.2 g/dL (ref 6.0–8.3)

## 2014-11-16 LAB — CBC WITH DIFFERENTIAL/PLATELET
Basophils Absolute: 0 10*3/uL (ref 0.0–0.1)
Basophils Relative: 0 % (ref 0–1)
EOS PCT: 0 % (ref 0–5)
Eosinophils Absolute: 0 10*3/uL (ref 0.0–0.7)
HEMATOCRIT: 37.3 % (ref 36.0–46.0)
HEMOGLOBIN: 13 g/dL (ref 12.0–15.0)
LYMPHS ABS: 0.7 10*3/uL (ref 0.7–4.0)
Lymphocytes Relative: 24 % (ref 12–46)
MCH: 26.2 pg (ref 26.0–34.0)
MCHC: 34.9 g/dL (ref 30.0–36.0)
MCV: 75.1 fL — AB (ref 78.0–100.0)
MONOS PCT: 8 % (ref 3–12)
Monocytes Absolute: 0.2 10*3/uL (ref 0.1–1.0)
Neutro Abs: 2.2 10*3/uL (ref 1.7–7.7)
Neutrophils Relative %: 68 % (ref 43–77)
Platelets: 118 10*3/uL — ABNORMAL LOW (ref 150–400)
RBC: 4.97 MIL/uL (ref 3.87–5.11)
RDW: 19.4 % — ABNORMAL HIGH (ref 11.5–15.5)
WBC: 3.1 10*3/uL — ABNORMAL LOW (ref 4.0–10.5)

## 2014-11-16 LAB — PROTIME-INR
INR: 2.41 — ABNORMAL HIGH (ref 0.00–1.49)
Prothrombin Time: 26.4 seconds — ABNORMAL HIGH (ref 11.6–15.2)

## 2014-11-16 LAB — MAGNESIUM: Magnesium: 2.2 mg/dL (ref 1.5–2.5)

## 2014-11-16 LAB — TSH: TSH: 0.687 u[IU]/mL (ref 0.350–4.500)

## 2014-11-16 LAB — PRO B NATRIURETIC PEPTIDE: Pro B Natriuretic peptide (BNP): >70000 pg/mL — ABNORMAL HIGH (ref 0–450)

## 2014-11-16 MED ORDER — FUROSEMIDE 10 MG/ML IJ SOLN
80.0000 mg | Freq: Four times a day (QID) | INTRAMUSCULAR | Status: DC
  Administered 2014-11-16 – 2014-11-20 (×15): 80 mg via INTRAVENOUS
  Filled 2014-11-16 (×24): qty 8

## 2014-11-16 MED ORDER — PRAVASTATIN SODIUM 40 MG PO TABS: 40.0000 mg | ORAL_TABLET | Freq: Every day | ORAL | Status: DC

## 2014-11-16 MED ORDER — SERTRALINE HCL 50 MG PO TABS
50.0000 mg | ORAL_TABLET | Freq: Every day | ORAL | Status: DC
Start: 2014-11-17 — End: 2014-11-20
  Administered 2014-11-17 – 2014-11-20 (×2): 50 mg via ORAL
  Filled 2014-11-16 (×4): qty 1

## 2014-11-16 MED ORDER — SODIUM CHLORIDE 0.9 % IJ SOLN: 3.0000 mL | INTRAMUSCULAR | Status: DC | PRN

## 2014-11-16 MED ORDER — CALCITRIOL 0.25 MCG PO CAPS: 0.2500 ug | ORAL_CAPSULE | ORAL | Status: DC

## 2014-11-16 MED ORDER — FERROUS SULFATE 325 (65 FE) MG PO TABS
325.0000 mg | ORAL_TABLET | Freq: Every day | ORAL | Status: DC
  Filled 2014-11-16: qty 1

## 2014-11-16 MED ORDER — ACETAMINOPHEN 325 MG PO TABS
650.0000 mg | ORAL_TABLET | ORAL | Status: DC | PRN
Start: 2014-11-16 — End: 2014-11-20

## 2014-11-16 MED ORDER — WARFARIN 1.25 MG HALF TABLET: 1.2500 mg | ORAL_TABLET | Freq: Every day | ORAL | Status: DC

## 2014-11-16 MED ORDER — PANTOPRAZOLE SODIUM 40 MG PO TBEC: 40.0000 mg | DELAYED_RELEASE_TABLET | Freq: Every day | ORAL | Status: DC

## 2014-11-16 MED ORDER — ONDANSETRON HCL 4 MG/2ML IJ SOLN
4.0000 mg | Freq: Four times a day (QID) | INTRAMUSCULAR | Status: DC | PRN
Start: 2014-11-16 — End: 2014-11-20

## 2014-11-16 MED ORDER — ALBUTEROL SULFATE (2.5 MG/3ML) 0.083% IN NEBU
3.0000 mL | INHALATION_SOLUTION | RESPIRATORY_TRACT | Status: DC | PRN
  Administered 2014-11-18: 3 mL via RESPIRATORY_TRACT
  Filled 2014-11-16: qty 3

## 2014-11-16 MED ORDER — WARFARIN SODIUM 2 MG PO TABS
2.0000 mg | ORAL_TABLET | ORAL | Status: DC
  Filled 2014-11-16: qty 1

## 2014-11-16 MED ORDER — SODIUM CHLORIDE 0.9 % IJ SOLN
3.0000 mL | Freq: Two times a day (BID) | INTRAMUSCULAR | Status: DC
  Administered 2014-11-17 – 2014-11-23 (×8): 3 mL via INTRAVENOUS

## 2014-11-16 MED ORDER — SODIUM CHLORIDE 0.9 % IV SOLN: 250.0000 mL | INTRAVENOUS | Status: DC | PRN

## 2014-11-16 MED ORDER — WARFARIN - PHARMACIST DOSING INPATIENT: Freq: Every day | Status: DC

## 2014-11-16 MED ORDER — CARVEDILOL 3.125 MG PO TABS
3.1250 mg | ORAL_TABLET | Freq: Two times a day (BID) | ORAL | Status: DC
  Administered 2014-11-17 – 2014-11-20 (×2): 3.125 mg via ORAL
  Filled 2014-11-16 (×9): qty 1

## 2014-11-16 MED ORDER — DOCUSATE SODIUM 100 MG PO CAPS
100.0000 mg | ORAL_CAPSULE | Freq: Every day | ORAL | Status: DC | PRN
  Filled 2014-11-16: qty 1

## 2014-11-16 MED ORDER — TIOTROPIUM BROMIDE MONOHYDRATE 18 MCG IN CAPS: 18.0000 ug | ORAL_CAPSULE | Freq: Every day | RESPIRATORY_TRACT | Status: DC

## 2014-11-16 MED ORDER — HYDRALAZINE HCL 10 MG PO TABS
10.0000 mg | ORAL_TABLET | Freq: Three times a day (TID) | ORAL | Status: DC
  Administered 2014-11-16 – 2014-11-18 (×5): 10 mg via ORAL
  Filled 2014-11-16 (×14): qty 1

## 2014-11-16 MED ORDER — ISOSORBIDE MONONITRATE 15 MG HALF TABLET
15.0000 mg | ORAL_TABLET | Freq: Every day | ORAL | Status: DC
  Filled 2014-11-16 (×5): qty 1

## 2014-11-16 MED ORDER — LISINOPRIL 2.5 MG PO TABS
2.5000 mg | ORAL_TABLET | Freq: Every day | ORAL | Status: DC
Start: 2014-11-16 — End: 2014-11-16

## 2014-11-16 NOTE — Progress Notes (Signed)
ANTICOAGULATION CONSULT NOTE - Initial Consult  Pharmacy Consult for Coumadin Indication: atrial fibrillation  No Known Allergies  Patient Measurements: Weight: 140 lb 10.5 oz (63.8 kg) Heparin Dosing Weight:    Vital Signs: Temp: 97.5 F (36.4 C) (12/14 1900) Temp Source: Oral (12/14 1900) BP: 94/63 mmHg (12/14 1900) Pulse Rate: 76 (12/14 1900)  Labs: No results for input(s): HGB, HCT, PLT, APTT, LABPROT, INR, HEPARINUNFRC, CREATININE, CKTOTAL, CKMB, TROPONINI in the last 72 hours.  Estimated Creatinine Clearance: 16.3 mL/min (by C-G formula based on Cr of 1.96).   Medical History: Past Medical History  Diagnosis Date  . CHF (congestive heart failure)   . Atrial fibrillation   . COPD (chronic obstructive pulmonary disease)   . Pacemaker   . Automatic implantable cardioverter-defibrillator in situ     Medications:  Med rec pending  Assessment: SOB and LE edema 80 y/o AAF that presents with edema and HF. Patient is a new [place] resident. Patient has a h/o MI, HF, COPD, and takes Coumadin for afib. INR at PCP office on 12/11 was 5.6 and warfarin has been held. CHA2DS2-VASCScore: Risk Score5.  Labs: Scr 1.95 (stable from previous hospitalizations), Hgb 13, Tbili elevated at 2.5. INR 2.41. TSH 0.687 ok.  Goal of Therapy:  INR 2-3 Monitor platelets by anticoagulation protocol: Yes   Plan:  No Coumadin tonight as dose documented as having been taken already 12/14.   Yashar Inclan S. Merilynn Finlandobertson, PharmD, BCPS Clinical Staff Pharmacist Pager [phone]  Misty Stanleyobertson, Katherina Wimer Stillinger 11/16/2014,9:04 PM

## 2014-11-16 NOTE — H&P (Addendum)
Katie Carlson is an 80 y.o. female.   Chief Complaint: Shortness of breath and leg edema. HPI: Katie SalvoMaria Carlson is a 80 year old African-American female who presents for evaluation of heart failure and lower extremity edema. [place] last [place] live with her daughter and she was no longer able to care for herself at home. Her daughter established care with Dr. Abigail Miyamotohacker as her PCP and he advised her to see cardiology consult due to history of MI, heart failure, and increasing lower extremity edema. She is accompanied by her daughter and granddaughter at the bedside.  According to her daughter, she was able to walk one month ago, but quit walking due to extreme fatigue and weakness. She was placed in a rehabilitation facility short term, but they said that she was not making any improvement she would either have to go into long-term care or she would have to go to live with family. Patient is a poor historian but she denies any chest pain, increased shortness of breath, PND, or orthopnea.  She in on coumadin for paroxysmal a.fib. INR level was checked Friday @ Dr Nils Flackhackers office and was 5.6. Warfarin was held until her visit today. INR today 2.2. Pt has Medtronic pacemaker and wants us to take over care of that as well.  Patient has not given me any information regarding her [facility], she was actually admitted [facility] months ago with acute decompensated heart failure.  This history was obtained only after patient had gone home and they thought about her situation and wanted to be admitted to the hospital.  She has been having worsening leg edema and over the past 2-3 days she has been having clear fluid coming through her legs.  She has not developed ulceration but has developed blebs on her toes.  She denies any chest pain, has been having mild orthopnea.  Patient's daughter and granddaughter of present.   Past Medical History  Diagnosis  Date  . CHF (congestive heart failure)   . Atrial fibrillation   . COPD (chronic obstructive pulmonary disease)   . Pacemaker   . Automatic implantable cardioverter-defibrillator in situ     Past Surgical History  Procedure Laterality Date  . Pacemaker insertion     Family history: there is no family history of premature coronary artery disease or diabetes in the family.  Social History:  reports that she has never smoked. She does not have any smokeless tobacco history on file. She reports that she does not drink alcohol or use illicit drugs.  Allergies: No Known Allergies  Medications Prior to Admission  Medication Sig Dispense Refill  . albuterol (PROVENTIL HFA;VENTOLIN HFA) 108 (90 BASE) MCG/ACT inhaler Inhale 2 puffs into the lungs every 4 (four) hours as needed for wheezing or shortness of breath.    . calcitRIOL (ROCALTROL) 0.25 MCG capsule Take 0.25 mcg by mouth 4 (four) times a week. On Mondays, Tuesdays, Wednesdays, and Thursdays only    . docusate sodium (COLACE) 100 MG capsule Take 100 mg by mouth daily as needed for mild constipation.    . ferrous sulfate 325 (65 FE) MG tablet Take 325 mg by mouth daily with breakfast.    . furosemide (LASIX) 40 MG tablet Take 40-80 mg by mouth 2 (two) times daily. 2 tablets (80 mg) in AM and 1 tablet (40 mg) in PM    . lisinopril (PRINIVIL,ZESTRIL) 2.5 MG tablet Take 2.5 mg by mouth daily.    .Marland Kitchen  pantoprazole (PROTONIX) 40 MG tablet Take 40 mg by mouth daily.    . pravastatin (PRAVACHOL) 40 MG tablet Take 40 mg by mouth at bedtime.     . sertraline (ZOLOFT) 50 MG tablet Take 50 mg by mouth daily.    Marland Kitchen tiotropium (SPIRIVA) 18 MCG inhalation capsule Place 18 mcg into inhaler and inhale daily.    Marland Kitchen warfarin (COUMADIN) 2.5 MG tablet Take 1.25-3.75 mg by mouth daily. Takes 1.5 tablets (3.75 mg) on Wednesdays, and take 1/2 tablet (1.25 mg) on all other days        Review of Systems - failure to thrive, generalized weakness, worsening leg  edema, shortness of breath, dark stools, states on iron pills.  No GI issues, nausea, vomiting or diarrhea.  No symptoms to suggest stroke or TIA or claudication.  Other systems negative.  She is not a diabetic.  Has been having difficulty walking due to generalized weakness and dizziness.  Blood pressure 94/63, pulse 76, temperature 97.5 F (36.4 C), temperature source Oral, resp. rate 18, weight 63.8 kg (140 lb 10.5 oz), SpO2 98 %.   General Mental Status- Alert. General Appearance- Cooperative and Appears stated age. Not in acute distress. Orientation- Oriented X3. Build & Nutrition- Lean and [place].   Head and Neck Thyroid Gland Characteristics- no palpable nodules. no palpable enlargement.   Chest and Lung Exam Auscultation: Breath sounds:- Clear.   Cardiovascular Inspection:Jugular vein- Bilateral- Distended. Heave- Left Ventricular. Palpation/Percussion: Left Ventricular Impulse:Location- 6th Left Intercostal Space Lateral to Midclavicular Line(Precordial heave present). S3 Palpable:Location- Apex (Left Ventricular Area). Auscultation:Heart Sounds- S1 is variable, S2 WNL and S3. Murmurs & Other Heart Sounds: Murmur:- No murmur.   Abdomen Palpation/Percussion:Palpation and Percussion of the abdomen reveal - Non Tender and No hepatosplenomegaly. Auscultation:Auscultation of the abdomen reveals - Bowel sounds normal.   Peripheral Vascular Lower Extremity:Inspection- Left- No Pigmentation or Varicose veins. Right- No Pigmentation or Varicose veins. Palpation:Edema- Bilateral- 4+ Pitting edema. Femoral pulse- Left- Normal. Right- Normal. Popliteal pulse- Left- Normal. Right- Normal. Dorsalis pedis pulse- Left- Normal. Right- Normal. Posterior tibial pulse- Left- Normal. Right- Normal. Carotid arteries- Left- No Carotid bruit. Right- No Carotid bruit. Abdomen- No prominent abdominal aortic pulsation or epigastric  bruit. Neurologic: Motor:- Grossly intact without any focal deficits. Labs:   Lab Results  Component Value Date   WBC 6.1 08/27/2014   HGB 11.1* 08/27/2014   HCT 34.7* 08/27/2014   MCV 82.8 08/27/2014   PLT 185 08/27/2014   No results for input(s): NA, K, CL, CO2, BUN, CREATININE, CALCIUM, PROT, BILITOT, ALKPHOS, ALT, AST, GLUCOSE in the last 168 hours.  Invalid input(s): LABALBU Lab Results  Component Value Date   CKTOTAL 64 08/19/2011   CKMB 3.5 08/19/2011   TROPONINI <0.30 08/27/2014    EKG: 08/26/2014: Coarse atrial fibrillation with ventricular response controlled.  Ventricularly paced rhythm.  No further analysis. EKG done in the office on 11/16/2014: Atrial fibrillation, ventricular paced rhythm, PVC.   Assessment/Plan 1.  Acute on chronic systolic and diastolic heart failure 2.  Ischemic dilated cardiomyopathy with history of coronary artery disease and myocardial infarction in the remote past. 3.  Chronic atrial fibrillation on chronic Coumadin therapy. CHA2DS2-VASCScore: Risk Score  5,  Yearly risk of stroke  6.7. Recommendation: ASA No/Anticoagulation Yes  OAC Has Bled: Score 2. Estimated bleeding risk: 1.88 to 3.2%. Estimated risk of major bleeding at 1 year with OAC. 4.  Chronic kidney disease stage 3-4 5.  Sick sinus syndrome, status post Medtronic pacemaker implantation 6.  Failure to thrive in an adult 7.  Long-term use of anticoagulants  Recommendation: Patient will be admitted to the hospital for diuresis.  I have had a very long discussion with the patient and her daughter and her granddaughter at the bedside regarding DNR status and advised the patient regarding end-of-life issues.  Initially we wanted to do outpatient therapy, after patient went home, daughter called us for admission to the hospital which I recommended if they were not comfortable.  Please see orders extremely guarded long-term prognosis in a patient who is 9 TSH.  Patient's daughter is  aware.   Pamella PertGANJI,JAGADEESH R, MD 11/16/2014, 8:56 PM [facility] Pager: [phone] Office: [phone] If no answer: Cell:  [phone]

## 2014-11-17 LAB — PROTIME-INR
INR: 2.44 — AB (ref 0.00–1.49)
PROTHROMBIN TIME: 26.7 s — AB (ref 11.6–15.2)

## 2014-11-17 LAB — BASIC METABOLIC PANEL
Anion gap: 14 (ref 5–15)
BUN: 39 mg/dL — ABNORMAL HIGH (ref 6–23)
CHLORIDE: 92 meq/L — AB (ref 96–112)
CO2: 27 meq/L (ref 19–32)
Calcium: 9.2 mg/dL (ref 8.4–10.5)
Creatinine, Ser: 1.95 mg/dL — ABNORMAL HIGH (ref 0.50–1.10)
GFR calc Af Amer: 25 mL/min — ABNORMAL LOW (ref >90)
GFR calc non Af Amer: 21 mL/min — ABNORMAL LOW (ref >90)
GLUCOSE: 102 mg/dL — AB (ref 70–99)
POTASSIUM: 3.5 meq/L — AB (ref 3.7–5.3)
SODIUM: 133 meq/L — AB (ref 137–147)

## 2014-11-17 LAB — PRO B NATRIURETIC PEPTIDE

## 2014-11-17 MED ORDER — METOLAZONE 5 MG PO TABS
5.0000 mg | ORAL_TABLET | Freq: Every day | ORAL | Status: DC
  Administered 2014-11-17: 5 mg via ORAL
  Filled 2014-11-17 (×3): qty 1

## 2014-11-17 MED ORDER — DOPAMINE-DEXTROSE 3.2-5 MG/ML-% IV SOLN
5.0000 ug/kg/min | INTRAVENOUS | Status: DC
  Administered 2014-11-17 – 2014-11-19 (×2): 5 ug/kg/min via INTRAVENOUS
  Filled 2014-11-17 (×3): qty 250

## 2014-11-17 MED ORDER — WARFARIN SODIUM 2 MG PO TABS
2.0000 mg | ORAL_TABLET | Freq: Every day | ORAL | Status: DC
  Administered 2014-11-17: 2 mg via ORAL
  Filled 2014-11-17 (×2): qty 1

## 2014-11-17 NOTE — Progress Notes (Addendum)
Pink tinged drainage noted on bed pad, foley catheter intact no leaking noted. Urine in bag is amber in color with some sediment.  Slightly redend area at foley insertion site. Dr Jacinto HalimGanji made aware and no new orders received. Will continue to monitor patient. Katie Carlson, Randall AnKristin Jessup RN

## 2014-11-17 NOTE — Progress Notes (Signed)
Utilization review completed.  

## 2014-11-17 NOTE — Progress Notes (Signed)
ANTICOAGULATION CONSULT NOTE - Follow Up Consult  Pharmacy Consult for Coumadin Indication: atrial fibrillation  No Known Allergies  Patient Measurements: Weight: 143 lb 11.8 oz (65.2 kg)  Vital Signs: Temp: 98 F (36.7 C) (12/15 0604) Temp Source: Oral (12/15 0604) BP: 94/52 mmHg (12/15 0604) Pulse Rate: 86 (12/15 0604)  Labs:  Recent Labs  11/16/14 2032 11/17/14 0403  HGB 13.0  --   HCT 37.3  --   PLT 118*  --   LABPROT 26.4* 26.7*  INR 2.41* 2.44*  CREATININE 1.95* 1.95*    Estimated Creatinine Clearance: 16.6 mL/min (by C-G formula based on Cr of 1.95).   Medications:  Prescriptions prior to admission  Medication Sig Dispense Refill Last Dose  . albuterol (PROVENTIL HFA;VENTOLIN HFA) 108 (90 BASE) MCG/ACT inhaler Inhale 2 puffs into the lungs every 4 (four) hours as needed for wheezing or shortness of breath.   unknown  . carvedilol (COREG) 3.125 MG tablet Take 3.125 mg by mouth 2 (two) times daily with a meal.   11/16/2014 at 1800  . furosemide (LASIX) 40 MG tablet Take 80 mg by mouth daily.    11/16/2014 at Unknown time  . metolazone (ZAROXOLYN) 5 MG tablet Take 5 mg by mouth daily.     . sertraline (ZOLOFT) 50 MG tablet Take 50 mg by mouth daily.   unknown  . torsemide (DEMADEX) 20 MG tablet Take 20 mg by mouth daily.   11/16/2014 at Unknown time  . warfarin (COUMADIN) 2 MG tablet Take 2 mg by mouth daily.   11/16/2014 at Unknown time    Assessment: 80 yo F on Coumadin PTA for hx afib.  INR is therapeutic on current home regimen.  Will continue.  Goal of Therapy:  INR 2-3 Monitor platelets by anticoagulation protocol: Yes   Plan:  Coumadin 2mg  PO q1800 Continue daily INR for now.  Toys 'R' UsKimberly Daion Ginsberg, Pharm.D., BCPS Clinical Pharmacist Pager [phone] 11/17/2014 10:17 AM

## 2014-11-17 NOTE — Progress Notes (Signed)
Nursing note Patient bp 83/50 manually Dr. Jacinto HalimGanji made aware and OK to give IV lasix, will monitor patient. Earlie Arciga, Randall AnKristin Jessup rN

## 2014-11-17 NOTE — Progress Notes (Addendum)
INITIAL NUTRITION ASSESSMENT  DOCUMENTATION CODES Per approved criteria  -Severe malnutrition in the context of chronic illness   INTERVENTION: No nutrition intervention at this time --- patient declined RD to follow for nutrition care plan  NUTRITION DIAGNOSIS: Increased nutrient needs related to malnutrition, chronic illness as evidenced by estimated nutrition needs  Goal: Pt to meet >/= 90% of their estimated nutrition needs   Monitor:  PO & supplemental intake, weight, labs, I/O's  Reason for Assessment: Malnutrition Screening Tool Report  80 y.o. female  Admitting Dx: shortness of breath, leg edema  ASSESSMENT: 80 y.o. Female with PMH of CHF, COPD; presented for evaluation of heart failure and lower extremity edema.  Limited nutrition hx obtained.  No family at bedside.  PO intake 50% per flowsheet records.  Pt lives with her daughter who pt reports does the cooking/helps her with meals.  Pt with visible severe muscle & fat loss to upper body.  RD offered oral nutrition supplements, however, pt declining at this time.  Nutrition Focused Physical Exam:  Subcutaneous Fat:  Orbital Region: N/A Upper Arm Region: severe depletion Thoracic and Lumbar Region: N/A  Muscle:  Temple Region: moderate depletion Clavicle Bone Region: severe depletion Clavicle and Acromion Bone Region: severe depletion Scapular Bone Region: N/A Dorsal Hand: N/A Patellar Region: N/A Anterior Thigh Region: N/A Posterior Calf Region: N/A  Edema: bilateral lower extremities   Patient meets criteria for severe malnutrition in the context of chronic illness as evidenced by severe muscle & subcutaneous fat loss.  Height: Ht Readings from Last 1 Encounters:  08/26/14 5\' 1"  (1.549 m)    Weight: Wt Readings from Last 1 Encounters:  11/17/14 143 lb 11.8 oz (65.2 kg)    Ideal Body Weight: 105 lb  % Ideal Body Weight: 136%  Wt Readings from Last 10 Encounters:  11/17/14 143 lb 11.8 oz  (65.2 kg)  08/28/14 145 lb 8 oz (65.998 kg)  08/25/11 145 lb 6.4 oz (65.953 kg)    Usual Body Weight: 145 lb  % Usual Body Weight: 98%  BMI:  Body mass index is 27.17 kg/(m^2).  Estimated Nutritional Needs: Kcal: 1400-1600 Protein: 70-80 gm Fluid: >/= 1.5 L  Skin: Intact  Diet Order: Diet renal W/12500mL fluid restriction  EDUCATION NEEDS: -No education needs identified at this time   Intake/Output Summary (Last 24 hours) at 11/17/14 1524 Last data filed at 11/17/14 1400  Gross per 24 hour  Intake 130.68 ml  Output    701 ml  Net -570.32 ml    Labs:   Recent Labs Lab 11/16/14 2032 11/17/14 0403  NA 133* 133*  K 3.5* 3.5*  CL 91* 92*  CO2 25 27  BUN 37* 39*  CREATININE 1.95* 1.95*  CALCIUM 9.4 9.2  MG 2.2  --   GLUCOSE 133* 102*    Scheduled Meds: . carvedilol  3.125 mg Oral BID WC  . furosemide  80 mg Intravenous Q6H  . hydrALAZINE  10 mg Oral 3 times per day  . isosorbide mononitrate  15 mg Oral Daily  . metolazone  5 mg Oral Daily  . sertraline  50 mg Oral Daily  . sodium chloride  3 mL Intravenous Q12H  . warfarin  2 mg Oral q1800  . Warfarin - Pharmacist Dosing Inpatient   Does not apply q1800    Continuous Infusions: . DOPamine 5 mcg/kg/min (11/17/14 1215)    Past Medical History  Diagnosis Date  . CHF (congestive heart failure)   . Atrial fibrillation   .  COPD (chronic obstructive pulmonary disease)   . Pacemaker   . Automatic implantable cardioverter-defibrillator in situ     Past Surgical History  Procedure Laterality Date  . Pacemaker insertion      Maureen ChattersKatie Asenath Balash, RD, LDN Pager #: [phone] After-Hours Pager #: [phone]

## 2014-11-18 DIAGNOSIS — E43 Unspecified severe protein-calorie malnutrition: Secondary | ICD-10-CM | POA: Diagnosis present

## 2014-11-18 LAB — BASIC METABOLIC PANEL
ANION GAP: 18 — AB (ref 5–15)
BUN: 40 mg/dL — ABNORMAL HIGH (ref 6–23)
CALCIUM: 9.4 mg/dL (ref 8.4–10.5)
CO2: 23 mEq/L (ref 19–32)
Chloride: 86 mEq/L — ABNORMAL LOW (ref 96–112)
Creatinine, Ser: 1.75 mg/dL — ABNORMAL HIGH (ref 0.50–1.10)
GFR calc non Af Amer: 24 mL/min — ABNORMAL LOW (ref >90)
GFR, EST AFRICAN AMERICAN: 28 mL/min — AB (ref >90)
Glucose, Bld: 129 mg/dL — ABNORMAL HIGH (ref 70–99)
Potassium: 3.5 mEq/L — ABNORMAL LOW (ref 3.7–5.3)
Sodium: 127 mEq/L — ABNORMAL LOW (ref 137–147)

## 2014-11-18 LAB — PROTIME-INR
INR: 3.33 — ABNORMAL HIGH (ref 0.00–1.49)
Prothrombin Time: 34.1 seconds — ABNORMAL HIGH (ref 11.6–15.2)

## 2014-11-18 LAB — PRO B NATRIURETIC PEPTIDE: Pro B Natriuretic peptide (BNP): >70000 pg/mL — ABNORMAL HIGH (ref 0–450)

## 2014-11-18 LAB — MRSA PCR SCREENING: MRSA BY PCR: NEGATIVE

## 2014-11-18 MED ORDER — MILRINONE IN DEXTROSE 20 MG/100ML IV SOLN
0.1250 ug/kg/min | INTRAVENOUS | Status: DC
  Administered 2014-11-18 – 2014-11-20 (×2): 0.125 ug/kg/min via INTRAVENOUS
  Filled 2014-11-18 (×2): qty 100

## 2014-11-18 MED ORDER — POTASSIUM CHLORIDE CRYS ER 20 MEQ PO TBCR
20.0000 meq | EXTENDED_RELEASE_TABLET | Freq: Two times a day (BID) | ORAL | Status: DC
  Administered 2014-11-19 – 2014-11-20 (×3): 20 meq via ORAL
  Filled 2014-11-18 (×5): qty 1

## 2014-11-18 NOTE — Progress Notes (Signed)
Patient remained lethargic most of the day with low blood pressures. Taking frequent vitals and assessing patient regularly for decline in status. PO meds were not given due to the patient's lethargy. Despite giving 80mg  of IV Lasix twice today, the patient's urine output remains decreased. Md. Jacinto HalimGanji was aware and okayed transfer to the stepdown unit. Called report to [place]lie receiving Rn on 2H. Patient's daughter was made of aware of the recent changes in the patient status and that she will be transferring to 2H25 for close monitoring. Transported patient's meds and belongings with to patient's new unit.

## 2014-11-18 NOTE — Progress Notes (Signed)
Subjective:  Feels much better, states that the leg edema is better, shortness of breath is also better.  Objective:  Vital Signs in the last 24 hours: Temp:  [97.8 F (36.6 C)-98 F (36.7 C)] 97.8 F (36.6 C) (12/15 2127) Pulse Rate:  [68-86] 85 (12/15 2127) Resp:  [16-18] 18 (12/15 2127) BP: (84-100)/(40-78) 100/63 mmHg (12/15 2127) SpO2:  [93 %-97 %] 97 % (12/15 2127) Weight:  [65.2 kg (143 lb 11.8 oz)] 65.2 kg (143 lb 11.8 oz) (12/15 0604)  Intake/Output from previous day: 12/15 0701 - 12/16 0700 In: 142.9 [P.O.:120; I.V.:22.9] Out: 1101 [Urine:1100; Stool:1]  Physical Exam:   General appearance: alert, cooperative, appears stated age and no distress Eyes: negative findings: lids and lashes normal Neck: no carotid bruit, supple, symmetrical, trachea midline, thyroid not enlarged, symmetric, no tenderness/mass/nodules and JVD present Neck: JVP - normal, carotids 2+= without bruits Resp: clear to auscultation bilaterally Chest wall: no tenderness Cardio: S1, S2 normal and S3 present GI: soft, non-tender; bowel sounds normal; no masses,  no organomegaly Extremities: edema 2-3 plus improved from yesterday 4 plus No skin breakdown    Lab Results: BMP  Recent Labs  08/28/14 0442 11/16/14 2032 11/17/14 0403  NA 142 133* 133*  K 4.0 3.5* 3.5*  CL 98 91* 92*  CO2 27 25 27   GLUCOSE 121* 133* 102*  BUN 33* 37* 39*  CREATININE 1.96* 1.95* 1.95*  CALCIUM 9.1 9.4 9.2  GFRNONAA 21* 21* 21*  GFRAA 25* 25* 25*    CBC  Recent Labs Lab 11/16/14 2032  WBC 3.1*  RBC 4.97  HGB 13.0  HCT 37.3  PLT 118*  MCV 75.1*  MCH 26.2  MCHC 34.9  RDW 19.4*  LYMPHSABS 0.7  MONOABS 0.2  EOSABS 0.0  BASOSABS 0.0    HEMOGLOBIN A1C No results found for: HGBA1C, MPG  Cardiac Panel (last 3 results)  Recent Labs  08/26/14 1900 08/27/14 0207 08/27/14 0715  TROPONINI <0.30 <0.30 <0.30    BNP (last 3 results)  Recent Labs  08/26/14 1510 11/16/14 2032 11/17/14 0403   PROBNP 64829.0* >70000.0* >70000.0*    TSH  Recent Labs  11/16/14 2032  TSH 0.687    CHOLESTEROL No results for input(s): CHOL in the last 8760 hours.  Hepatic Function Panel  Recent Labs  11/16/14 2032  PROT 6.2  ALBUMIN 3.1*  AST 29  ALT 32  ALKPHOS 92  BILITOT 2.5*     Cardiac Studies:  Assessment/Plan:  1. Acute on chronic systolic and diastolic heart failure 2. Ischemic dilated cardiomyopathy with history of coronary artery disease and myocardial infarction in the remote past. 3. Chronic atrial fibrillation on chronic Coumadin therapy. CHA2DS2-VASCScore: Risk Score 5, Yearly risk of stroke 6.7. Recommendation: ASA No/Anticoagulation Yes  OAC Has Bled: Score 2. Estimated bleeding risk: 1.88 to 3.2%. Estimated risk of major bleeding at 1 year with OAC. 4. Chronic kidney disease stage 3-4 5. Sick sinus syndrome, status post Medtronic pacemaker implantation 6. Failure to thrive in an adult, protein calorie malnutrition  7. Long-term use of anticoagulants  Recommendation: Patient is presently doing much better, has noticed improvement in leg edema and the weeping in her legs is not present, hence will continue present medical therapy, although the urinary output appears to be borderline, as clinically she if his to be doing much better. I will start the patient on dobutamine for 24-48 hours.   Pamella PertGANJI,JAGADEESH R, M.D. 11/18/2014, 1:22 AM [facility] Pager: [phone] Office: [phone] If no answer:  [phone]  

## 2014-11-18 NOTE — Progress Notes (Signed)
Subjective:  More shortness of breath today and feels weak.  Objective:  Vital Signs in the last 24 hours: Temp:  [97.3 F (36.3 C)-98.7 F (37.1 C)] 98 F (36.7 C) (12/16 2000) Pulse Rate:  [78-98] 88 (12/16 2000) Resp:  [18-25] 18 (12/16 2000) BP: (90-107)/(56-64) 107/56 mmHg (12/16 2000) SpO2:  [89 %-97 %] 95 % (12/16 2000) Weight:  [63.1 kg (139 lb 1.8 oz)-65.6 kg (144 lb 10 oz)] 63.1 kg (139 lb 1.8 oz) (12/16 1830)  Intake/Output from previous day: 12/15 0701 - 12/16 0700 In: 384.4 [P.O.:270; I.V.:114.4] Out: 1626 [Urine:1625; Stool:1]  Physical Exam:   General appearance: alert, cooperative, appears stated age and mild distress Eyes: negative findings: lids and lashes normal Neck: no carotid bruit, supple, symmetrical, trachea midline, thyroid not enlarged, symmetric, no tenderness/mass/nodules and JVD present Neck: JVP - normal, carotids 2+= without bruits Resp: clear to auscultation bilaterally Chest wall: no tenderness Cardio: S1, S2 normal and S3 present GI: soft, non-tender; bowel sounds normal; no masses,  no organomegaly Extremities: edema 2-3 plus. No skin breakdown    Lab Results: BMP  Recent Labs  11/16/14 2032 11/17/14 0403 11/18/14 0326  NA 133* 133* 127*  K 3.5* 3.5* 3.5*  CL 91* 92* 86*  CO2 25 27 23   GLUCOSE 133* 102* 129*  BUN 37* 39* 40*  CREATININE 1.95* 1.95* 1.75*  CALCIUM 9.4 9.2 9.4  GFRNONAA 21* 21* 24*  GFRAA 25* 25* 28*    CBC  Recent Labs Lab 11/16/14 2032  WBC 3.1*  RBC 4.97  HGB 13.0  HCT 37.3  PLT 118*  MCV 75.1*  MCH 26.2  MCHC 34.9  RDW 19.4*  LYMPHSABS 0.7  MONOABS 0.2  EOSABS 0.0  BASOSABS 0.0    HEMOGLOBIN A1C No results found for: HGBA1C, MPG  Cardiac Panel (last 3 results)  Recent Labs  08/26/14 1900 08/27/14 0207 08/27/14 0715  TROPONINI <0.30 <0.30 <0.30    BNP (last 3 results)  Recent Labs  11/16/14 2032 11/17/14 0403 11/18/14 0326  PROBNP >70000.0* >70000.0* >70000.0*     TSH  Recent Labs  11/16/14 2032  TSH 0.687    CHOLESTEROL No results for input(s): CHOL in the last 8760 hours.  Hepatic Function Panel  Recent Labs  11/16/14 2032  PROT 6.2  ALBUMIN 3.1*  AST 29  ALT 32  ALKPHOS 92  BILITOT 2.5*     Cardiac Studies: echocardiogram 11/18/2014: Severe LV systolic dysfunction, EF less than 10%, global hypokinesis and dilated cardiomyopathy.  Moderately severe mitral and tricuspid regurgitation, moderate pulmonary hypertension.  Mild to moderate RV dilatation and moderate decrease in RV systolic function.  Assessment/Plan:  1. Acute on chronic systolic and diastolic heart failure 2. Ischemic dilated cardiomyopathy with history of coronary artery disease and myocardial infarction in the remote past. 3. Chronic atrial fibrillation on chronic Coumadin therapy. CHA2DS2-VASCScore: Risk Score 5, Yearly risk of stroke 6.7. Recommendation: ASA No/Anticoagulation Yes  OAC Has Bled: Score 2. Estimated bleeding risk: 1.88 to 3.2%. Estimated risk of major bleeding at 1 year with OAC. 4. Chronic kidney disease stage 3-4 5. Sick sinus syndrome, status post Medtronic pacemaker implantation 6. Failure to thrive in an adult, protein calorie malnutrition  7. Hyponatremia 8. Long-term use of anticoagulants  Recommendation: patient's prognosis extremely guarded, I have started her on milrinone.  She will need dobutamine for inotropic support to maintain a blood pressure.  Patient's family has been updated regarding her status.  Pamella PertGANJI,JAGADEESH R, M.D. 11/18/2014, 9:25 PM [facility] Pager: [phone] Office: [phone] If no answer: [phone]

## 2014-11-18 NOTE — Consult Note (Addendum)
WOC wound consult note Reason for Consult: Consult requested for buttocks.   Wound type:several patchy scattered areas of partial thickness wounds, none larger than .1X.1X.1cm.  Appearance consistent with moisture-associated skin damage. Pressure Ulcer POA: These were present on admission, they are NOT pressure ulcers. Wound bed: pink and moist Drainage (amount, consistency, odor)Small amt pink drainage, no odor  Periwound: Intact skin surrounding. Dressing procedure/placement/frequency: Foam dressing to protect and promote healing. Please re-consult if further assistance is needed.  Thank-you,  Cammie Mcgeeawn Jadarrius Maselli MSN, RN, CWOCN, SolisWCN-AP, CNS [phone]

## 2014-11-18 NOTE — Progress Notes (Signed)
  Echocardiogram 2D Echocardiogram has been performed.  Arvil ChacoFoster, Grayton Lobo 11/18/2014, 2:53 PM

## 2014-11-18 NOTE — Progress Notes (Signed)
RT called by RN for possible stridor and pt SOB. Pt sleeping when RT arrived BBS clear, no stridor present, pt states when asked that she is short of breath. PRN treatment of Albuterol given with HHN mask.

## 2014-11-18 NOTE — Progress Notes (Signed)
Pharmacy Consult for Milrinone (Primacor) Initiation  Indication:   Acute Decompensated Heart Failure with volume overload and low cardiac output  No Known Allergies  Temp:  [97.5 F (36.4 C)-98.3 F (36.8 C)] 98.3 F (36.8 C) (12/16 0846) Pulse Rate:  [68-90] 90 (12/16 0846) Cardiac Rhythm:  [-] Ventricular paced (12/15 1940) Resp:  [16-18] 18 (12/16 0441) BP: (90-100)/(40-78) 90/63 mmHg (12/16 0846) SpO2:  [91 %-97 %] 95 % (12/16 1051) Weight:  [144 lb 10 oz (65.6 kg)] 144 lb 10 oz (65.6 kg) (12/16 0441)  LABS    Component Value Date/Time   NA 127* 11/18/2014 0326   K 3.5* 11/18/2014 0326   CL 86* 11/18/2014 0326   CO2 23 11/18/2014 0326   GLUCOSE 129* 11/18/2014 0326   BUN 40* 11/18/2014 0326   CREATININE 1.75* 11/18/2014 0326   CALCIUM 9.4 11/18/2014 0326   GFRNONAA 24* 11/18/2014 0326   GFRAA 28* 11/18/2014 0326   Last magnesium:  Lab Results  Component Value Date   MG 2.2 11/16/2014   Estimated Creatinine Clearance: 18.5 mL/min (by C-G formula based on Cr of 1.75).    Intake/Output Summary (Last 24 hours) at 11/18/14 1132 Last data filed at 11/18/14 0700  Gross per 24 hour  Intake 264.38 ml  Output    925 ml  Net -660.62 ml    Filed Weights   11/16/14 1900 11/17/14 0604 11/18/14 0441  Weight: 140 lb 10.5 oz (63.8 kg) 143 lb 11.8 oz (65.2 kg) 144 lb 10 oz (65.6 kg)    Assessment:  80 y.o. female admitted 11/16/2014 with acute decompensated congestive heart failure to be initiated on milrinone.   Clinically pt reports improvement after IV Lasix, but UOP is marginal and weight has actually increased.  Patient's output is only 925 ml in last 24 hours despite IV furosemide 80 mg every 6 hours.   Milrinone can cause arrhythmias.  Monitor patient for ECG changes.  Goal K >4.  Will page MD for replacement orders since current K is 3.5.  Plan is to initiate milrinone for inotropic support.  Plan:  1. Initiate milrinone based on renal function: (Consider  starting dose of 0.125 - 0.25 for patients with SBP <1810mmHg) Select One Calculated CrCl Dose  []  > 50 ml/min 0.375 mcg/kg/min  []  20-49 ml/min 0.250 mcg/kg/min  [x]  < 20 ml/min 0.125 mcg/kg/min   2. Nursing to monitor vital signs per milrinone protocol and physician parameters. 3. Pharmacy to follow peripherally, please reconsult if needed or there is further questions. 4.  Please contact MD for further dosing instructions.  Thank you for allowing us to be a part of this patient's care.  Toys 'R' UsKimberly Jermiah Howton, Pharm.D., BCPS Clinical Pharmacist Pager [phone] 11/18/2014 11:38 AM

## 2014-11-18 NOTE — Clinical Documentation Improvement (Signed)
Abnormal Lab: Sodium: 12/12: 127. 12/15: 133.   Possible Clinical Conditions? > Hyponatremia > Other > Not able to determine    Thank Gabriel CirriYou, Brynn Reznik Mathews-Bethea,RN,BSN,Clinical Documentation Specialist [phone] [email or web address] [email or web address]

## 2014-11-18 NOTE — Progress Notes (Signed)
ANTICOAGULATION CONSULT NOTE - Follow Up Consult  Pharmacy Consult for Coumadin Indication: atrial fibrillation  No Known Allergies  Patient Measurements: Weight: 144 lb 10 oz (65.6 kg)  Vital Signs: Temp: 98.3 F (36.8 C) (12/16 0846) Temp Source: Oral (12/16 0846) BP: 90/63 mmHg (12/16 0846) Pulse Rate: 90 (12/16 0846)  Labs:  Recent Labs  11/16/14 2032 11/17/14 0403 11/18/14 0326  HGB 13.0  --   --   HCT 37.3  --   --   PLT 118*  --   --   LABPROT 26.4* 26.7* 34.1*  INR 2.41* 2.44* 3.33*  CREATININE 1.95* 1.95* 1.75*    Estimated Creatinine Clearance: 18.5 mL/min (by C-G formula based on Cr of 1.75).   Medications:  Prescriptions prior to admission  Medication Sig Dispense Refill Last Dose  . albuterol (PROVENTIL HFA;VENTOLIN HFA) 108 (90 BASE) MCG/ACT inhaler Inhale 2 puffs into the lungs every 4 (four) hours as needed for wheezing or shortness of breath.   unknown  . carvedilol (COREG) 3.125 MG tablet Take 3.125 mg by mouth 2 (two) times daily with a meal.   11/16/2014 at 1800  . furosemide (LASIX) 40 MG tablet Take 80 mg by mouth daily.    11/16/2014 at Unknown time  . metolazone (ZAROXOLYN) 5 MG tablet Take 5 mg by mouth daily.     . sertraline (ZOLOFT) 50 MG tablet Take 50 mg by mouth daily.   unknown  . torsemide (DEMADEX) 20 MG tablet Take 20 mg by mouth daily.   11/16/2014 at Unknown time  . warfarin (COUMADIN) 2 MG tablet Take 2 mg by mouth daily.   11/16/2014 at Unknown time    Assessment: 80 yo F on Coumadin PTA for hx afib.  INR is now above goal on current home regimen.  Will hold Coumadin tonight.  Goal of Therapy:  INR 2-3 Monitor platelets by anticoagulation protocol: Yes   Plan:  No Coumadin tonight. Continue daily INR for now.  Toys 'R' UsKimberly Shia Delaine, Pharm.D., BCPS Clinical Pharmacist Pager [phone] 11/18/2014 10:22 AM

## 2014-11-19 ENCOUNTER — Encounter (HOSPITAL_COMMUNITY): Payer: Self-pay

## 2014-11-19 LAB — BASIC METABOLIC PANEL
ANION GAP: 21 — AB (ref 5–15)
BUN: 48 mg/dL — ABNORMAL HIGH (ref 6–23)
CHLORIDE: 90 meq/L — AB (ref 96–112)
CO2: 23 meq/L (ref 19–32)
Calcium: 9.1 mg/dL (ref 8.4–10.5)
Creatinine, Ser: 2.3 mg/dL — ABNORMAL HIGH (ref 0.50–1.10)
GFR calc Af Amer: 20 mL/min — ABNORMAL LOW (ref >90)
GFR calc non Af Amer: 18 mL/min — ABNORMAL LOW (ref >90)
Glucose, Bld: 125 mg/dL — ABNORMAL HIGH (ref 70–99)
Potassium: 3.3 mEq/L — ABNORMAL LOW (ref 3.7–5.3)
SODIUM: 134 meq/L — AB (ref 137–147)

## 2014-11-19 LAB — PRO B NATRIURETIC PEPTIDE

## 2014-11-19 LAB — PROTIME-INR
INR: 4.82 — ABNORMAL HIGH (ref 0.00–1.49)
Prothrombin Time: 45.5 seconds — ABNORMAL HIGH (ref 11.6–15.2)

## 2014-11-19 MED ORDER — DOPAMINE-DEXTROSE 3.2-5 MG/ML-% IV SOLN
0.0000 ug/kg/min | INTRAVENOUS | Status: DC
  Administered 2014-11-20: 6 ug/kg/min via INTRAVENOUS
  Filled 2014-11-19: qty 250

## 2014-11-19 NOTE — Care Management Note (Addendum)
    Page 1 of 1   11/20/2014     11:55:50 AM CARE MANAGEMENT NOTE 11/20/2014  Patient:  Katie Carlson,Katie Carlson   Account Number:  [id number]  Date Initiated:  11/18/2014  Documentation initiated by:  Donn PieriniWEBSTER,KRISTI  Subjective/Objective Assessment:   Pt admitted with CHF     Action/Plan:   PTA pt was living [place]- [place]- not sure if family can continue to care for pt in the home- - pt currently non-resp.- on Dopamine - NCM to follow for d/c plan may need PC.   Anticipated DC Date:  11/23/2014   Anticipated DC Plan:  SKILLED NURSING FACILITY  In-house referral  Clinical Social Worker  Hospice / Palliative Care      DC Planning Services  CM consult      Choice offered to / List presented to:             Status of service:  Completed, signed off Medicare Important Message given?  YES (If response is "NO", the following Medicare IM given date fields will be blank) Date Medicare IM given:  11/19/2014 Medicare IM given by:  Junius CreamerWELL,DEBBIE Date Additional Medicare IM given:   Additional Medicare IM given by:    Discharge Disposition:    Per UR Regulation:  Reviewed for med. necessity/level of care/duration of stay  If discussed at Long Length of Stay Meetings, dates discussed:    Comments:  12/18 1154a debbie Chastin Garlitz rn,bsn saw dr Jacinto Halimganji note. pal care consult placed. note states may want to take home w hospice. have left hospice hhc list in room. will await pal care eval.

## 2014-11-19 NOTE — Progress Notes (Signed)
ANTICOAGULATION CONSULT NOTE - Follow Up Consult  Pharmacy Consult for warfarin Indication: atrial fibrillation  No Known Allergies  Patient Measurements: Height: 5\' 3"  (160 cm) Weight: 139 lb 12.4 oz (63.4 kg) IBW/kg (Calculated) : 52.4  Vital Signs: Temp: 98 F (36.7 C) (12/17 0400) Temp Source: Oral (12/17 0400) BP: 107/42 mmHg (12/17 1000) Pulse Rate: 75 (12/17 1000)  Labs:  Recent Labs  11/16/14 2032 11/17/14 0403 11/18/14 0326 11/19/14 0324  HGB 13.0  --   --   --   HCT 37.3  --   --   --   PLT 118*  --   --   --   LABPROT 26.4* 26.7* 34.1* 45.5*  INR 2.41* 2.44* 3.33* 4.82*  CREATININE 1.95* 1.95* 1.75* 2.30*    Estimated Creatinine Clearance: 14.6 mL/min (by C-G formula based on Cr of 2.3).   Medications:  Scheduled:  . carvedilol  3.125 mg Oral BID WC  . furosemide  80 mg Intravenous Q6H  . hydrALAZINE  10 mg Oral 3 times per day  . isosorbide mononitrate  15 mg Oral Daily  . metolazone  5 mg Oral Daily  . potassium chloride  20 mEq Oral BID  . sertraline  50 mg Oral Daily  . sodium chloride  3 mL Intravenous Q12H  . Warfarin - Pharmacist Dosing Inpatient   Does not apply q1800    Assessment: 80 yo AAF presented with edema and HF exacerbation. Patient with history of atrial fibrillation on chronic warfarin PTA. Home warfarin dose 2mg  daily. Of note, patient seen in PCP office on 12/11 and INR was 5.6, warfarin was held until admission. INR on admission was 2.41 and patient's home dose was restarted. INR jumped supratherapeutic on 12/16 and warfarin was held. INR today has jumped even more to the supratherapeutic range to 4.82. No medication identified causing this jump. Per RN no bleeding at this time. Administration of Vitamin K is not recommended at this time. Hgb/Hct from 12/14 normal. No CBC since. LFTs on 12/14 normal.    Goal of Therapy:  INR 2-3 Monitor platelets by anticoagulation protocol: Yes   Plan:  Hold warfarin tonight Daily  INR/PT CBC and CMET ordered with AM labs Monitor s/sx of bleeding  Thank you for allowing pharmacy to be part of this patient's care team  Domnique Vanegas M. Jayshaun Phillips, Pharm.D Clinical Pharmacy Resident Pager: [phone] 11/19/2014 .10:54 AM

## 2014-11-19 NOTE — Progress Notes (Signed)
Spoke with Dr. Jacinto HalimGanji regarding patient's possible need for CVC placement due to Dopamine infusion and need for co-ox testing while on Milrinone. Dr. Jacinto HalimGanji asked for CCM to be asked about possible placement tonight, if not one could be placed by cardiology in the morning. Spoke with Dr. Molli KnockYacoub about placement, unable to obtain tonight.

## 2014-11-19 NOTE — Progress Notes (Addendum)
Subjective:  More alert this morning, overnight patient was confused, yesterday she was markedly dyspneic, patient this morning states that her breathing is markedly improved, overall she's feeling well and has an appetite. Objective:  Vital Signs in the last 24 hours: Temp:  [97.5 F (36.4 C)-98.1 F (36.7 C)] 97.5 F (36.4 C) (12/17 1252) Pulse Rate:  [74-92] 80 (12/17 1600) Resp:  [12-26] 12 (12/17 1600) BP: (75-125)/(28-65) 108/60 mmHg (12/17 1600) SpO2:  [89 %-95 %] 93 % (12/17 1600) Weight:  [63.1 kg (139 lb 1.8 oz)-63.4 kg (139 lb 12.4 oz)] 63.4 kg (139 lb 12.4 oz) (12/17 0400)  Intake/Output from previous day: 12/16 0701 - 12/17 0700 In: 182.7 [I.V.:182.7] Out: 850 [Urine:850]  Physical Exam:   General appearance: alert, cooperative, appears stated age and no distress Eyes: negative findings: lids and lashes normal Neck: no carotid bruit, supple, symmetrical, trachea midline, thyroid not enlarged, symmetric, no tenderness/mass/nodules and JVD present Neck: JVP - normal, carotids 2+= without bruits Resp: rales bilaterally and scattered  Chest wall: no tenderness Cardio: S1, S2 normal and S3 present GI: soft, non-tender; bowel sounds normal; no masses,  no organomegaly Extremities: edema 2 plus. No skin breakdown  Lab Results: BMP  Recent Labs  11/17/14 0403 11/18/14 0326 11/19/14 0324  NA 133* 127* 134*  K 3.5* 3.5* 3.3*  CL 92* 86* 90*  CO2 27 23 23   GLUCOSE 102* 129* 125*  BUN 39* 40* 48*  CREATININE 1.95* 1.75* 2.30*  CALCIUM 9.2 9.4 9.1  GFRNONAA 21* 24* 18*  GFRAA 25* 28* 20*    CBC  Recent Labs Lab 11/16/14 2032  WBC 3.1*  RBC 4.97  HGB 13.0  HCT 37.3  PLT 118*  MCV 75.1*  MCH 26.2  MCHC 34.9  RDW 19.4*  LYMPHSABS 0.7  MONOABS 0.2  EOSABS 0.0  BASOSABS 0.0    HEMOGLOBIN A1C No results found for: HGBA1C, MPG  Cardiac Panel (last 3 results)  Recent Labs  08/26/14 1900 08/27/14 0207 08/27/14 0715  TROPONINI <0.30 <0.30 <0.30     BNP (last 3 results)  Recent Labs  11/17/14 0403 11/18/14 0326 11/19/14 0324  PROBNP >70000.0* >70000.0* >70000.0*    TSH  Recent Labs  11/16/14 2032  TSH 0.687    CHOLESTEROL No results for input(s): CHOL in the last 8760 hours.  Hepatic Function Panel  Recent Labs  11/16/14 2032  PROT 6.2  ALBUMIN 3.1*  AST 29  ALT 32  ALKPHOS 92  BILITOT 2.5*     Cardiac Studies: echocardiogram 11/18/2014: Severe LV systolic dysfunction, EF less than 10%, global hypokinesis and dilated cardiomyopathy.  Moderately severe mitral and tricuspid regurgitation, moderate pulmonary hypertension.  Mild to moderate RV dilatation and moderate decrease in RV systolic function.  Assessment/Plan:  1. Acute on chronic systolic and diastolic heart failure 2. Ischemic dilated cardiomyopathy with history of coronary artery disease and myocardial infarction in the remote past. 3. Chronic atrial fibrillation on chronic Coumadin therapy. CHA2DS2-VASCScore: Risk Score 5, Yearly risk of stroke 6.7. Recommendation: ASA No/Anticoagulation Yes  OAC Has Bled: Score 2. Estimated bleeding risk: 1.88 to 3.2%. Estimated risk of major bleeding at 1 year with OAC. 4. Chronic kidney disease stage 4 5. Sick sinus syndrome, status post [employer] pacemaker implantation 6. Failure to thrive in an adult, protein calorie malnutrition  7. Hyponatremia 8. Long-term use of anticoagulants  9.moderately severe mitral and tricuspid regurgitation.  Recommendation: patient's prognosis extremely guarded, I have started her on milrinone, she has diuresed fairly well,  her serum creatinine is trending up, I will discontinue dopamine. Patient's family has been updated regarding her status. I have discussed with him regarding end-of-life issues, I do not think patient can survive beyond a few months at most, probably 2 weeks.  After discussions, they're willing to consider hospice at home.  I'll put a consultation.   Same.  If indeed she goes into hospice, I will also discontinue Coumadin.Marland Kitchen.  Pamella PertGANJI,JAGADEESH R, M.D. 11/19/2014, 5:59 PM [facility] Pager: [phone] Office: [phone] If no answer: [phone]

## 2014-11-20 LAB — PRO B NATRIURETIC PEPTIDE: Pro B Natriuretic peptide (BNP): >70000 pg/mL — ABNORMAL HIGH (ref 0–450)

## 2014-11-20 LAB — COMPREHENSIVE METABOLIC PANEL
ALK PHOS: 112 U/L (ref 39–117)
ALT: 40 U/L — AB (ref 0–35)
AST: 49 U/L — AB (ref 0–37)
Albumin: 2.9 g/dL — ABNORMAL LOW (ref 3.5–5.2)
Anion gap: 21 — ABNORMAL HIGH (ref 5–15)
BUN: 54 mg/dL — ABNORMAL HIGH (ref 6–23)
CALCIUM: 9.2 mg/dL (ref 8.4–10.5)
CO2: 26 mEq/L (ref 19–32)
Chloride: 87 mEq/L — ABNORMAL LOW (ref 96–112)
Creatinine, Ser: 2.03 mg/dL — ABNORMAL HIGH (ref 0.50–1.10)
GFR calc Af Amer: 24 mL/min — ABNORMAL LOW (ref >90)
GFR calc non Af Amer: 20 mL/min — ABNORMAL LOW (ref >90)
GLUCOSE: 109 mg/dL — AB (ref 70–99)
POTASSIUM: 3.1 meq/L — AB (ref 3.7–5.3)
SODIUM: 134 meq/L — AB (ref 137–147)
TOTAL PROTEIN: 6.2 g/dL (ref 6.0–8.3)
Total Bilirubin: 2.5 mg/dL — ABNORMAL HIGH (ref 0.3–1.2)

## 2014-11-20 LAB — CBC
HEMATOCRIT: 41.4 % (ref 36.0–46.0)
Hemoglobin: 14.1 g/dL (ref 12.0–15.0)
MCH: 25.4 pg — AB (ref 26.0–34.0)
MCHC: 34.1 g/dL (ref 30.0–36.0)
MCV: 74.5 fL — ABNORMAL LOW (ref 78.0–100.0)
PLATELETS: 134 10*3/uL — AB (ref 150–400)
RBC: 5.56 MIL/uL — ABNORMAL HIGH (ref 3.87–5.11)
RDW: 19.9 % — ABNORMAL HIGH (ref 11.5–15.5)
WBC: 7.4 10*3/uL (ref 4.0–10.5)

## 2014-11-20 LAB — PROTIME-INR
INR: 3.41 — ABNORMAL HIGH (ref 0.00–1.49)
Prothrombin Time: 34.7 seconds — ABNORMAL HIGH (ref 11.6–15.2)

## 2014-11-20 MED ORDER — POTASSIUM CHLORIDE ER 10 MEQ PO TBCR
40.0000 meq | EXTENDED_RELEASE_TABLET | Freq: Once | ORAL | Status: AC
  Administered 2014-11-20: 40 meq via ORAL
  Filled 2014-11-20 (×2): qty 4

## 2014-11-20 MED ORDER — MAGNESIUM SULFATE 2 GM/50ML IV SOLN
2.0000 g | Freq: Once | INTRAVENOUS | Status: AC
  Administered 2014-11-20: 2 g via INTRAVENOUS
  Filled 2014-11-20: qty 50

## 2014-11-20 MED ORDER — MORPHINE SULFATE 4 MG/ML IJ SOLN
4.0000 mg | INTRAMUSCULAR | Status: DC | PRN
  Administered 2014-11-23: 4 mg via INTRAVENOUS
  Filled 2014-11-20: qty 1

## 2014-11-20 MED ORDER — POTASSIUM CHLORIDE ER 10 MEQ PO TBCR
40.0000 meq | EXTENDED_RELEASE_TABLET | ORAL | Status: AC
  Administered 2014-11-20: 40 meq via ORAL
  Filled 2014-11-20 (×3): qty 4

## 2014-11-20 NOTE — Progress Notes (Signed)
Attempted to give pt PO potassium at this time. Pt swallowed 2 of the 4 pills. She threw up the 3rd pill and took the 4th pill crushed in applesauce. Pt then proceeded to throw up again, minimal amount.

## 2014-11-20 NOTE — Progress Notes (Signed)
ANTICOAGULATION CONSULT NOTE - Follow Up Consult  Pharmacy Consult for warfarin Indication: atrial fibrillation  No Known Allergies  Patient Measurements: Height: 5\' 3"  (160 cm) Weight: 135 lb 5.8 oz (61.4 kg) IBW/kg (Calculated) : 52.4  Vital Signs: Temp: 97.9 F (36.6 C) (12/18 0730) Temp Source: Oral (12/18 0730) BP: 95/47 mmHg (12/18 0730) Pulse Rate: 93 (12/18 0730)  Labs:  Recent Labs  11/18/14 0326 11/19/14 0324 11/20/14 0311  HGB  --   --  14.1  HCT  --   --  41.4  PLT  --   --  134*  LABPROT 34.1* 45.5* 34.7*  INR 3.33* 4.82* 3.41*  CREATININE 1.75* 2.30* 2.03*    Estimated Creatinine Clearance: 15.2 mL/min (by C-G formula based on Cr of 2.03).   Medications:  Scheduled:  . carvedilol  3.125 mg Oral BID WC  . furosemide  80 mg Intravenous Q6H  . hydrALAZINE  10 mg Oral 3 times per day  . isosorbide mononitrate  15 mg Oral Daily  . magnesium sulfate 1 - 4 g bolus IVPB  2 g Intravenous Once  . potassium chloride  40 mEq Oral Q2H  . potassium chloride  20 mEq Oral BID  . sertraline  50 mg Oral Daily  . sodium chloride  3 mL Intravenous Q12H  . Warfarin - Pharmacist Dosing Inpatient   Does not apply q1800    Assessment: 80 yo F admitted with edema and HF exacerbation. Pt w/ history of afib, on chronic warfarin PTA. Home dose 2mg  daily. INR has been supratherapeutic and warfarin has been held. INR remains supretherapeutic today at 3.41. Hgb/Hct normal, plts low, stable. No bleeding noted. Renal function improving, AKI could be source of elevated INR.   Goal of Therapy:  INR 2-3 Monitor platelets by anticoagulation protocol: Yes   Plan:  Hold warfarin tonight Monitor s/sx of bleeding Daily INR/PT  Thank you for allowing pharmacy to be part of this patient's care team  Keisa Blow M. Dustin Burrill, Pharm.D Clinical Pharmacy Resident Pager: [phone] 11/20/2014 .9:44 AM

## 2014-11-20 NOTE — Progress Notes (Signed)
Spoke with pts daughter, Katie Carlson, in room tonight. She wanted more information about hospice at home or her mothers plan of care. Will palliative care team please set up a time to meet with Josie to discuss options.

## 2014-11-20 NOTE — Progress Notes (Signed)
Spoke with MD Jacinto HalimGanji on phone, MD talked with daughter, Lianne CureJosie, about comfort measures. Daughter is okay with stopping all medications at this time. Orders placed to turn off dopamine and stop all medications. Morphine PRN as needed for discomfort or agitation.

## 2014-11-20 NOTE — Progress Notes (Signed)
Paged MD Jacinto HalimGanji to discuss patient potassium administration and ALOC. RN wanted to know plan of care for patient.

## 2014-11-20 NOTE — Progress Notes (Signed)
Crushed pts potassium at this time and attempted to feed her medication with pudding. Pt took most of med by mouth even with multiple prompts pt would not swallow. Pt spitting meds out of mouth at this time.

## 2014-11-20 NOTE — Progress Notes (Signed)
Subjective:  Patient was very lucid this morning when I saw her.  However I have been following her through nursing over the telephone, patient has had episodes of significant confusion.  After Primacor was discontinued, she had hypotension and patient is presently on 10 g of dopamine. Objective:  Vital Signs in the last 24 hours: Temp:  [97.5 F (36.4 C)-97.9 F (36.6 C)] 97.7 F (36.5 C) (12/18 1112) Pulse Rate:  [26-100] 79 (12/18 1430) Resp:  [14-26] 14 (12/18 1430) BP: (73-113)/(38-64) 107/64 mmHg (12/18 1430) SpO2:  [90 %-96 %] 92 % (12/18 1430) Weight:  [61.4 kg (135 lb 5.8 oz)] 61.4 kg (135 lb 5.8 oz) (12/18 0300)  Intake/Output from previous day: 12/17 0701 - 12/18 0700 In: 275.4 [P.O.:120; I.V.:155.4] Out: 2527 [Urine:2526; Stool:1]  Physical Exam:   General appearance: alert, cooperative, appears stated age and no distress Eyes: negative findings: lids and lashes normal Neck: no carotid bruit, supple, symmetrical, trachea midline, thyroid not enlarged, symmetric, no tenderness/mass/nodules and JVD present Neck: JVP - normal, carotids 2+= without bruits Resp: rales bilaterally and scattered  Chest wall: no tenderness Cardio: S1, S2 normal and S3 present GI: soft, non-tender; bowel sounds normal; no masses,  no organomegaly Extremities: edema 2 plus. No skin breakdown  Lab Results: BMP  Recent Labs  11/18/14 0326 11/19/14 0324 11/20/14 0311  NA 127* 134* 134*  K 3.5* 3.3* 3.1*  CL 86* 90* 87*  CO2 23 23 26   GLUCOSE 129* 125* 109*  BUN 40* 48* 54*  CREATININE 1.75* 2.30* 2.03*  CALCIUM 9.4 9.1 9.2  GFRNONAA 24* 18* 20*  GFRAA 28* 20* 24*    CBC  Recent Labs Lab 11/16/14 2032 11/20/14 0311  WBC 3.1* 7.4  RBC 4.97 5.56*  HGB 13.0 14.1  HCT 37.3 41.4  PLT 118* 134*  MCV 75.1* 74.5*  MCH 26.2 25.4*  MCHC 34.9 34.1  RDW 19.4* 19.9*  LYMPHSABS 0.7  --   MONOABS 0.2  --   EOSABS 0.0  --   BASOSABS 0.0  --     HEMOGLOBIN A1C No results found  for: HGBA1C, MPG  Cardiac Panel (last 3 results)  Recent Labs  08/26/14 1900 08/27/14 0207 08/27/14 0715  TROPONINI <0.30 <0.30 <0.30    BNP (last 3 results)  Recent Labs  11/18/14 0326 11/19/14 0324 11/20/14 0311  PROBNP >70000.0* >70000.0* >70000.0*    TSH  Recent Labs  11/16/14 2032  TSH 0.687    CHOLESTEROL No results for input(s): CHOL in the last 8760 hours.  Hepatic Function Panel  Recent Labs  11/16/14 2032 11/20/14 0311  PROT 6.2 6.2  ALBUMIN 3.1* 2.9*  AST 29 49*  ALT 32 40*  ALKPHOS 92 112  BILITOT 2.5* 2.5*     Cardiac Studies: echocardiogram 11/18/2014: Severe LV systolic dysfunction, EF less than 10%, global hypokinesis and dilated cardiomyopathy.  Moderately severe mitral and tricuspid regurgitation, moderate pulmonary hypertension.  Mild to moderate RV dilatation and moderate decrease in RV systolic function.  Assessment/Plan:  1. Acute on chronic systolic and diastolic heart failure 2. Ischemic dilated cardiomyopathy with history of coronary artery disease and myocardial infarction in the remote past. 3. Chronic atrial fibrillation on chronic Coumadin therapy. CHA2DS2-VASCScore: Risk Score 5, Yearly risk of stroke 6.7. Recommendation: ASA No/Anticoagulation Yes  OAC Has Bled: Score 2. Estimated bleeding risk: 1.88 to 3.2%. Estimated risk of major bleeding at 1 year with OAC. 4. Chronic kidney disease stage 4 5. Sick sinus syndrome, status post [employer]tr[employer] pacemaker  implantation 6. Severe protein calorie malnutrition. 7. Hyponatremia 8. Long-term use of anticoagulants  9. Moderately severe mitral and tricuspid regurgitation. 10. Abnormal liver enzymes Recommendation: patient's prognosis extremely guarded, I am not very convinced that she will leave the hospital alive, survival is unlikely.  Patient's family including the daughter is very much aware of the situation.  Palliative care has been consulted, I'll continue to be of  assistance.  Conservative therapy for now, depending on her response over the next 24-48 hours, either continued aggressive medical therapy versus comfort care will be considered. Pamella PertGANJI,JAGADEESH R, M.D. 11/20/2014, 5:16 PM [facility] Pager: [phone] Office: [phone] If no answer: [phone]

## 2014-11-20 NOTE — Clinical Documentation Improvement (Signed)
Supporting Information: Registered Dietician's 12/15 evaluation notes Severe Malnutrition. Hospital Problem list noted Protein Calorie Malnutrition, Severe (chronic), POA(11/18/14). Failure to Thrive in an adult, Protein Calorie Malnutrition per 12/17 progress notes.      Possible Clinical Condition?  . Severity: --Mild Malnutrition (first degree) --Moderate Malnutrition (second degree) --Severe Malnutrition (third degree) --Other . Document any associated diagnoses/conditions    Thank Gabriel CirriYou, Kellyann Ordway Mathews-Bethea,RN,BSN,Clinical Documentation Specialist [phone] [email or web address]

## 2014-11-20 NOTE — Progress Notes (Signed)
Spoke with Dr. Nadara EatonGangi concerning bp low. Advised to titrate dopamine for systolic bp >=85. Hold afternoon dose of lasix.

## 2014-11-21 NOTE — Progress Notes (Signed)
Pacemaker turned "off" at this time by [employer] specialist, Noreene LarssonJill at bedside. Noreene LarssonJill removed magnet at this time as well.  RN will continue to monitor.

## 2014-11-21 NOTE — Progress Notes (Addendum)
Called [employer] per End of life order set. Joe, [employer] rep informed RN to place magnet on pts AICD until she arrived around 5:30 am 12/19. RN will continue to monitor.

## 2014-11-21 NOTE — Plan of Care (Signed)
Problem: Phase I Progression Outcomes Goal: Up in chair, BRP Outcome: Progressing Up in chair for dinner

## 2014-11-21 NOTE — Progress Notes (Signed)
Patient is made comfort care. Patient is very comfortable and in no acute distress. Responds yes and no appropriately. Will transfer to regular floor. Family questions answered. Pacer turned off.

## 2014-11-22 NOTE — Plan of Care (Signed)
Problem: Phase I Progression Outcomes Goal: Pain controlled with appropriate interventions Outcome: Completed/Met Date Met:  11/22/14 Does not c/o pain; however, does tell her daughter that she is tired and wishes to plan her funeral.

## 2014-11-22 NOTE — Progress Notes (Signed)
Chaplain responded to request for spiritual care.  Daughter Lianne CureJosie spoke about caring for her mother on her own without support from 7 other siblings.  Patient's granddaughter and great-grandsons arrived later and Josie took video of patient and children singing Christmas carols.  Patient is Catholic, and daughter, who is a strong believer in prayer, is non-denominational.  Chaplain offered a referral to a Catholic priest but this was declined.  Patient is from [place] and speaks fluent Spanish.  Chaplain engaged in conversation and prayer in [place].  Any staff member who speaks Spanish should be encouraged to speak to the patient in Spanish. Offered emotional support and facilitated storytelling primarily with daughter. Daughter states that patient occasionally sees people who aren't there or says things that don't make sense.   Daughter recognizes that her mother could be dying and is at peace with that possibility. Please call for further support as needed or requested.    Theodoro Parmaalacios, Averee Harb N, Chaplain [phone]    11/22/14 1716  Clinical Encounter Type  Visited With Patient and family together  Visit Type Spiritual support  Referral From Family  Consult/Referral To Chaplain  Recommendations Offer daughter ongoing chaplain support as needed.  Spiritual Encounters  Spiritual Needs Prayer;Emotional  Stress Factors  Family Stress Factors Family relationships;Major life changes

## 2014-11-23 NOTE — Progress Notes (Signed)
Subjective:  Patient  Sleeping comfortably. Does open eyes but does not respond  Objective:  Vital Signs in the last 24 hours: Temp:  [97.5 F (36.4 C)-98.1 F (36.7 C)] 97.5 F (36.4 C) (12/21 0438) Pulse Rate:  [76-77] 76 (12/21 0438) Resp:  [18-19] 18 (12/21 0438) BP: (79-83)/(45-58) 79/45 mmHg (12/21 0438) SpO2:  [90 %-95 %] 90 % (12/21 0438) Weight:  [59.1 kg (130 lb 4.7 oz)] 59.1 kg (130 lb 4.7 oz) (12/21 0438)  Intake/Output from previous day:    Physical Exam: Chest wall: no tenderness Cardio: S1, S2 normal and S3 present GI: soft, non-tender; bowel sounds normal; no masses, no organomegaly Extremities: edema 2 plus. No skin breakdown Lab Results: BMP  Recent Labs  11/18/14 0326 11/19/14 0324 11/20/14 0311  NA 127* 134* 134*  K 3.5* 3.3* 3.1*  CL 86* 90* 87*  CO2 23 23 26   GLUCOSE 129* 125* 109*  BUN 40* 48* 54*  CREATININE 1.75* 2.30* 2.03*  CALCIUM 9.4 9.1 9.2  GFRNONAA 24* 18* 20*  GFRAA 28* 20* 24*    CBC  Recent Labs Lab 11/16/14 2032 11/20/14 0311  WBC 3.1* 7.4  RBC 4.97 5.56*  HGB 13.0 14.1  HCT 37.3 41.4  PLT 118* 134*  MCV 75.1* 74.5*  MCH 26.2 25.4*  MCHC 34.9 34.1  RDW 19.4* 19.9*  LYMPHSABS 0.7  --   MONOABS 0.2  --   EOSABS 0.0  --   BASOSABS 0.0  --     HEMOGLOBIN A1C No results found for: HGBA1C, MPG  Cardiac Panel (last 3 results)  Recent Labs  08/26/14 1900 08/27/14 0207 08/27/14 0715  TROPONINI <0.30 <0.30 <0.30    BNP (last 3 results)  Recent Labs  11/18/14 0326 11/19/14 0324 11/20/14 0311  PROBNP >70000.0* >70000.0* >70000.0*    TSH  Recent Labs  11/16/14 2032  TSH 0.687    CHOLESTEROL No results for input(s): CHOL in the last 8760 hours.  Hepatic Function Panel  Recent Labs  11/16/14 2032 11/20/14 0311  PROT 6.2 6.2  ALBUMIN 3.1* 2.9*  AST 29 49*  ALT 32 40*  ALKPHOS 92 112  BILITOT 2.5* 2.5*     Assessment/Plan:  1. Ends-tage CHF presently on comfort measures Rec: Will  discuss with daughter if she would prefer home hospice vs IP care. No changes done. Patient is comfortable   Pamella PertGANJI,JAGADEESH R, M.D. 11/23/2014, 10:06 AM [facility] Pager: [phone] Office: [phone] If no answer: [phone]

## 2014-11-23 NOTE — Progress Notes (Signed)
11/23/14 Discussed hospice care options with daughter Lianne CureJosie.  [place] in [place]  Josie is expressed goals to be for comfort and symptom management.  Patient with significant cardiac hx, with implant pacer defibrillator. [employer] disabled the device on 12/19 per orders and patient was transferred to [facility] for comfort care.  Patient did open her eyes to gentle touch, smiled, no verbal response to questions regarding pain although it does not appear that she is currently having any distress.    Referral is recommended to residential hospice, [place] in [place], [facility] is appropriate.  I told Josie that someone would [facility] to discuss eligibility and possible transfer.  Order will be placed for [facility] inpatient hospice referral.  Cindra PresumeSue Ellen Charle Clear, RN, MSN, [facility] Palliative Integration

## 2014-11-23 NOTE — Clinical Social Work Note (Signed)
CSW received referral for [facility]idential [facility]osp[facility] Placement [facility]. CSW has made appropriate [facility]. CSW to continue to follow and assist with discharge planning needs.  Marcelline Deistmily Corliss Coggeshall, LCSWA [phone]) Licensed Clinical Social Worker Orthopedics [phone]([id number]) and Surgical [phone]([id number])

## 2014-11-23 NOTE — Progress Notes (Signed)
Nutrition Brief Note  Chart reviewed. Pt now transitioning to comfort care.  No further nutrition interventions warranted at this time.  Please re-consult as needed.   Loyce DysKacie Naveen Lorusso, MS RD LDN Clinical Inpatient Dietitian

## 2014-11-24 MED ORDER — MORPHINE SULFATE 4 MG/ML IJ SOLN: 4.0000 mg | INTRAMUSCULAR | Status: AC | PRN

## 2014-11-24 NOTE — Clinical Social Work Note (Signed)
CSW received report from [facility] admissions liaison pt has been offered bed at [facility] for 11/24/2014. [facility] admissions liaison contacted pt's daughter and scheduled for admissions paperwork to be [facility] at noon on 11/24/2014.  [facility]ility[facility] Report number: [id number] [address]  Marcelline Deistmily Myrissa Chipley, LCSWA [phone]) Licensed Clinical Social Worker Orthopedics [phone]([id number]) and Surgical [phone]([id number])

## 2014-11-24 NOTE — Progress Notes (Signed)
Report called to nurse Herbert SetaHeather at [facility] at 1250.

## 2014-11-24 NOTE — Discharge Summary (Signed)
Physician Discharge Summary  Patient ID: Katie SalvoMaria Carlson MRN: [id number] DOB/AGE: 78/02/1922 80 y.o.  Admit date: 11/16/2014 Discharge date: 11/24/2014  Primary Discharge Diagnosis End stage CHF,, acute on chronic systolic and diastolic heart failure Secondary Discharge Diagnosis 1. Acute on chronic systolic and diastolic heart failure. 2. Ischemic dilated cardiomyopathy with history of coronary artery disease and myocardial infarction in the remote past. 3. Chronic atrial fibrillation on chronic Coumadin therapy. CHA2DS2-VASCScore: Risk Score 5, Yearly risk of stroke 6.7. Recommendation: ASA No/Anticoagulation Yes  OAC Has Bled: Score 2. Estimated bleeding risk: 1.88 to 3.2%. Estimated risk of major bleeding at 1 year with OAC. 4. Chronic kidney disease stage 3-4 5. Sick sinus syndrome, status post [employer] pacemaker implantation. Pacemaker is turned off this admission due to comfort care measures and end of life discussion with patient's family. 6. Failure to thrive in an adult. 7. Severe protein calorie malnutrition.   Significant Diagnostic Studies: echocardiogram 11/18/2014: Severe LV systolic dysfunction, EF less than 10%, global hypokinesis and dilated cardiomyopathy. Moderately severe mitral and tricuspid regurgitation, moderate pulmonary hypertension. Mild to moderate RV dilatation and moderate decrease in RV systolic function.  Consults: Paliative care  Hospital Course: Patient admitted for management of acute decompensated CHF and was started on Dopamine due to low BP and then added Primacor. Patient continued to deteriorate and was also confused.  After family discusseion, patient made comfort care. Please see hospital notes for complete details.  Discharge Exam: Blood pressure 90/57, pulse 78, temperature 98.1 F (36.7 C), temperature source Axillary, resp. rate 18, height 5\' 3"  (1.6 m), weight 60.328 kg (133 lb), SpO2 93 %.   Patient is confused and does not  respond but does nod with verbal command. Chest wall: no tenderness Cardio: S1, S2 normal and S3 present GI: soft, non-tender; bowel sounds normal; no masses, no organomegaly Extremities: edema 2 plus. No skin breakdown Labs:   Lab Results  Component Value Date   WBC 7.4 11/20/2014   HGB 14.1 11/20/2014   HCT 41.4 11/20/2014   MCV 74.5* 11/20/2014   PLT 134* 11/20/2014    Recent Labs Lab 11/20/14 0311  NA 134*  K 3.1*  CL 87*  CO2 26  BUN 54*  CREATININE 2.03*  CALCIUM 9.2  PROT 6.2  BILITOT 2.5*  ALKPHOS 112  ALT 40*  AST 49*  GLUCOSE 109*   Lab Results  Component Value Date   CKTOTAL 64 08/19/2011   CKMB 3.5 08/19/2011   TROPONINI <0.30 08/27/2014    Lipid Panel  No results found for: CHOL, TRIG, HDL, CHOLHDL, VLDL, LDLCALC   Radiology: No results found.    FOLLOW UP PLANS AND APPOINTMENTS    Medication List    STOP taking these medications        carvedilol 3.125 MG tablet  Commonly known as:  COREG     furosemide 40 MG tablet  Commonly known as:  LASIX     metolazone 5 MG tablet  Commonly known as:  ZAROXOLYN     sertraline 50 MG tablet  Commonly known as:  ZOLOFT     torsemide 20 MG tablet  Commonly known as:  DEMADEX     warfarin 2 MG tablet  Commonly known as:  COUMADIN      TAKE these medications        albuterol 108 (90 BASE) MCG/ACT inhaler  Commonly known as:  PROVENTIL HFA;VENTOLIN HFA  Inhale 2 puffs into the lungs every 4 (four) hours as needed for wheezing or  shortness of breath.     morphine 4 MG/ML injection  Inject 1 mL (4 mg total) into the muscle every 2 (two) hours as needed for pain or severe pain (agitation, comfort care).          Pamella PertGANJI,JAGADEESH R, MD 11/24/2014, 9:49 AM  Pager: [phone] Office: [phone] If no answer: [phone]

## 2014-11-30 ENCOUNTER — Encounter: Payer: Medicare Other | Admitting: Internal Medicine

## 2014-12-04 DEATH — deceased

## 2016-07-11 IMAGING — CR DG CHEST 2V
1 series · 1 of 1 positions shown · non-contrast
Comparison: 08/20/2011

CLINICAL DATA: Short breath

EXAM:
CHEST  2 VIEW

[view not recorded]
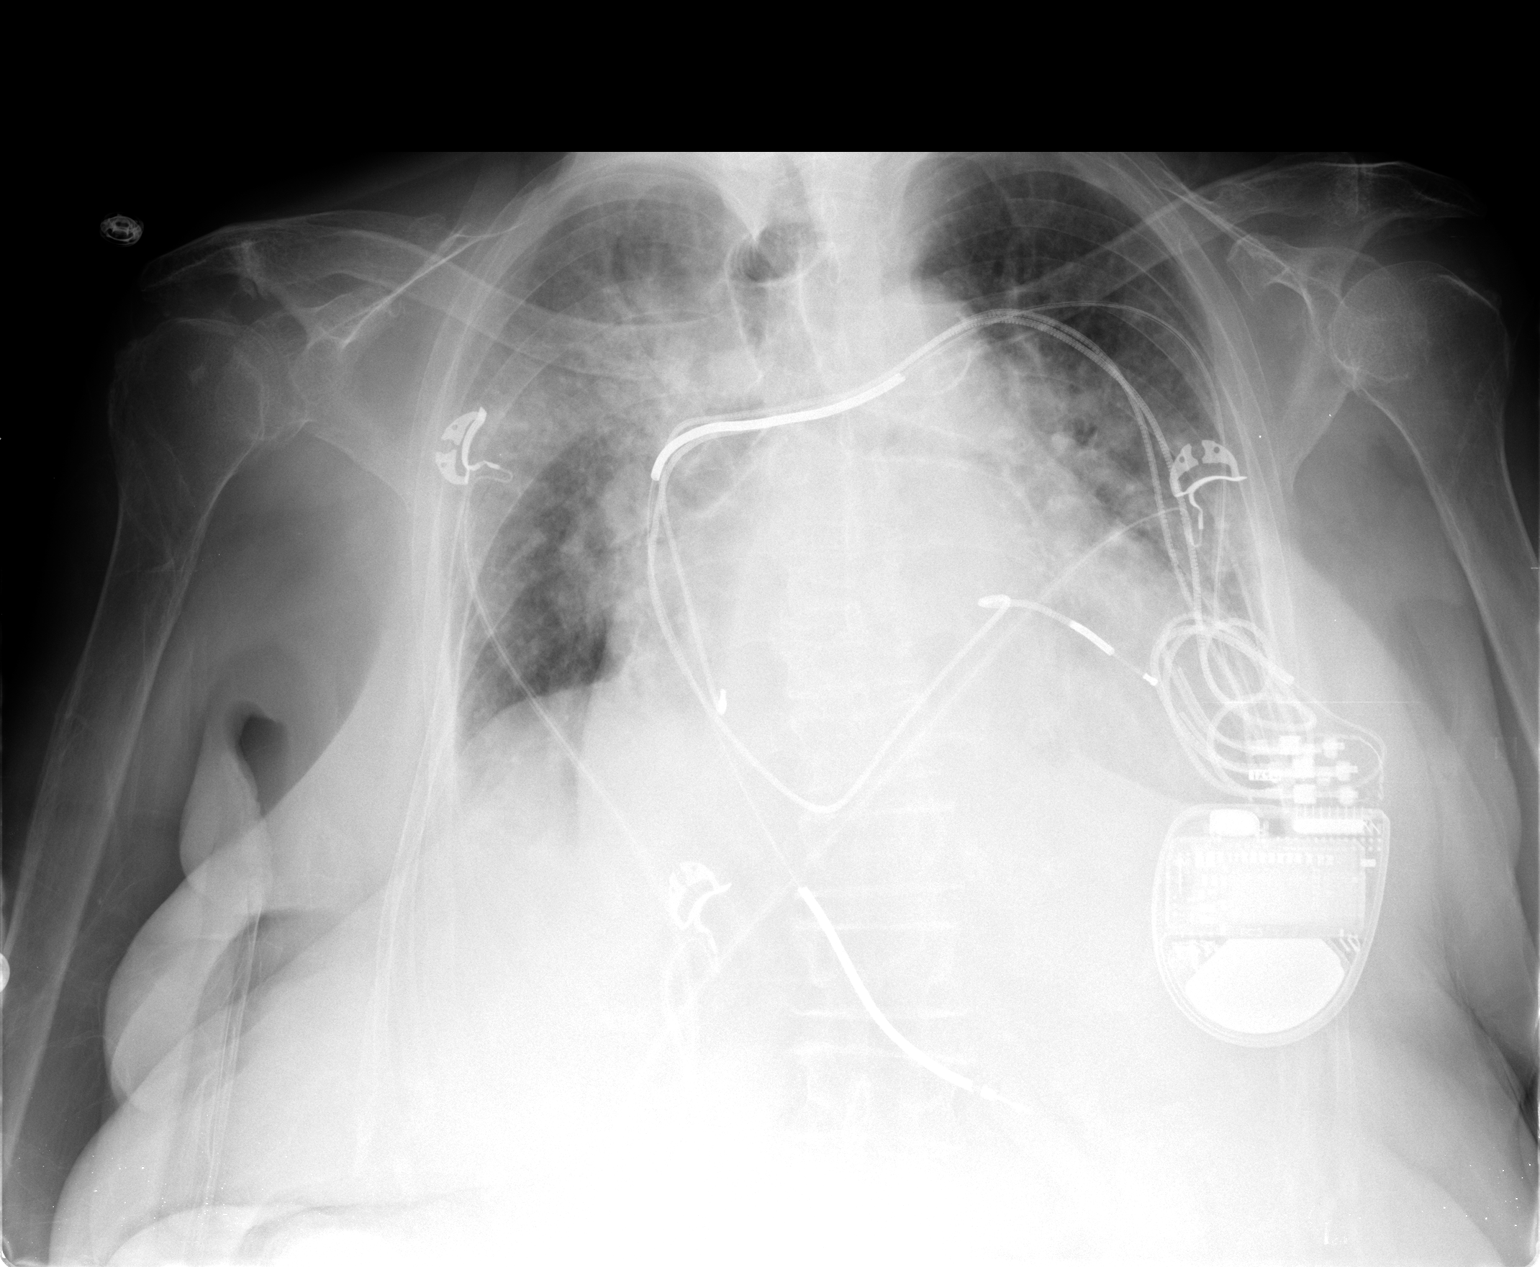

[1 of 1 positions shown; findings below may reference images not displayed]

FINDINGS: Left-sided pacemaker with continuous leads overlies stable enlarged
cardiac silhouette. Increase in perihilar airspace disease. Low lung
volumes. Pneumothorax. Small effusions noted.
IMPRESSION: Increased airspace disease a perihilar distribution suggests
pulmonary edema.

Small effusions.
